# Patient Record
Sex: Female | Born: 1935 | Race: White | Hispanic: No | Marital: Single | Smoking: Former smoker
Health system: Southern US, Community
[De-identification: ages and names within clinical notes are randomized; demographics above are authoritative.]

## PROBLEM LIST (undated history)

## (undated) DIAGNOSIS — I480 Paroxysmal atrial fibrillation: Secondary | ICD-10-CM

## (undated) DIAGNOSIS — I739 Peripheral vascular disease, unspecified: Secondary | ICD-10-CM

## (undated) DIAGNOSIS — C679 Malignant neoplasm of bladder, unspecified: Secondary | ICD-10-CM

## (undated) DIAGNOSIS — I219 Acute myocardial infarction, unspecified: Secondary | ICD-10-CM

## (undated) DIAGNOSIS — Z87891 Personal history of nicotine dependence: Secondary | ICD-10-CM

## (undated) DIAGNOSIS — Z794 Long term (current) use of insulin: Secondary | ICD-10-CM

## (undated) DIAGNOSIS — E119 Type 2 diabetes mellitus without complications: Secondary | ICD-10-CM

## (undated) DIAGNOSIS — IMO0001 Reserved for inherently not codable concepts without codable children: Secondary | ICD-10-CM

## (undated) DIAGNOSIS — I2510 Atherosclerotic heart disease of native coronary artery without angina pectoris: Secondary | ICD-10-CM

## (undated) DIAGNOSIS — Z950 Presence of cardiac pacemaker: Secondary | ICD-10-CM

## (undated) DIAGNOSIS — E785 Hyperlipidemia, unspecified: Secondary | ICD-10-CM

## (undated) DIAGNOSIS — H9191 Unspecified hearing loss, right ear: Secondary | ICD-10-CM

## (undated) DIAGNOSIS — H9190 Unspecified hearing loss, unspecified ear: Secondary | ICD-10-CM

## (undated) HISTORY — DX: Atherosclerotic heart disease of native coronary artery without angina pectoris: I25.10

## (undated) HISTORY — DX: Type 2 diabetes mellitus without complications: E11.9

## (undated) HISTORY — DX: Peripheral vascular disease, unspecified: I73.9

## (undated) HISTORY — DX: Malignant neoplasm of bladder, unspecified: C67.9

## (undated) HISTORY — DX: Paroxysmal atrial fibrillation: I48.0

## (undated) HISTORY — DX: Reserved for inherently not codable concepts without codable children: IMO0001

## (undated) HISTORY — PX: ABDOMINAL HYSTERECTOMY: SHX81

## (undated) HISTORY — DX: Personal history of nicotine dependence: Z87.891

## (undated) HISTORY — PX: CATARACT EXTRACTION: SUR2

## (undated) HISTORY — DX: Presence of cardiac pacemaker: Z95.0

## (undated) HISTORY — DX: Acute myocardial infarction, unspecified: I21.9

## (undated) HISTORY — DX: Hyperlipidemia, unspecified: E78.5

## (undated) HISTORY — DX: Long term (current) use of insulin: Z79.4

## (undated) HISTORY — PX: CARPAL TUNNEL RELEASE: SHX101

---

## 1988-07-28 HISTORY — PX: BLADDER REPAIR: SHX76

## 1991-07-29 DIAGNOSIS — I219 Acute myocardial infarction, unspecified: Secondary | ICD-10-CM

## 1991-07-29 HISTORY — DX: Acute myocardial infarction, unspecified: I21.9

## 1991-07-29 HISTORY — PX: CORONARY ARTERY BYPASS GRAFT: SHX141

## 2005-07-28 DIAGNOSIS — C679 Malignant neoplasm of bladder, unspecified: Secondary | ICD-10-CM

## 2005-07-28 HISTORY — DX: Malignant neoplasm of bladder, unspecified: C67.9

## 2005-07-28 HISTORY — PX: BLADDER SURGERY: SHX569

## 2006-04-03 HISTORY — PX: CARDIAC CATHETERIZATION: SHX172

## 2006-08-17 ENCOUNTER — Ambulatory Visit (HOSPITAL_COMMUNITY): Admission: RE | Admit: 2006-08-17 | Discharge: 2006-08-17 | Payer: Self-pay | Admitting: Urology

## 2007-01-07 ENCOUNTER — Ambulatory Visit (HOSPITAL_COMMUNITY): Admission: RE | Admit: 2007-01-07 | Discharge: 2007-01-07 | Payer: Self-pay | Admitting: *Deleted

## 2007-01-13 ENCOUNTER — Ambulatory Visit (HOSPITAL_COMMUNITY): Admission: RE | Admit: 2007-01-13 | Discharge: 2007-01-13 | Payer: Self-pay | Admitting: Cardiology

## 2007-07-27 ENCOUNTER — Inpatient Hospital Stay (HOSPITAL_COMMUNITY): Admission: EM | Admit: 2007-07-27 | Discharge: 2007-07-31 | Payer: Self-pay | Admitting: Emergency Medicine

## 2007-07-30 HISTORY — PX: PACEMAKER INSERTION: SHX728

## 2010-06-04 ENCOUNTER — Ambulatory Visit (HOSPITAL_COMMUNITY): Admission: RE | Admit: 2010-06-04 | Discharge: 2010-06-04 | Payer: Self-pay | Admitting: Ophthalmology

## 2010-06-11 ENCOUNTER — Emergency Department (HOSPITAL_COMMUNITY): Admission: EM | Admit: 2010-06-11 | Discharge: 2010-06-11 | Payer: Self-pay | Admitting: Emergency Medicine

## 2010-06-14 ENCOUNTER — Ambulatory Visit (HOSPITAL_COMMUNITY)
Admission: RE | Admit: 2010-06-14 | Discharge: 2010-06-14 | Payer: Self-pay | Source: Home / Self Care | Admitting: Internal Medicine

## 2010-06-18 ENCOUNTER — Ambulatory Visit (HOSPITAL_COMMUNITY): Admission: RE | Admit: 2010-06-18 | Discharge: 2010-06-18 | Payer: Self-pay | Admitting: Ophthalmology

## 2010-06-19 ENCOUNTER — Ambulatory Visit (HOSPITAL_COMMUNITY): Admission: RE | Admit: 2010-06-19 | Discharge: 2010-06-19 | Payer: Self-pay | Admitting: Orthopedic Surgery

## 2010-08-17 ENCOUNTER — Encounter: Payer: Self-pay | Admitting: Internal Medicine

## 2010-10-08 LAB — DIFFERENTIAL
Basophils Absolute: 0.1 10*3/uL (ref 0.0–0.1)
Eosinophils Absolute: 0.6 10*3/uL (ref 0.0–0.7)
Eosinophils Relative: 4 % (ref 0–5)
Lymphs Abs: 2.1 10*3/uL (ref 0.7–4.0)
Monocytes Absolute: 1.3 10*3/uL — ABNORMAL HIGH (ref 0.1–1.0)

## 2010-10-08 LAB — CBC
HCT: 40.9 % (ref 36.0–46.0)
MCH: 28 pg (ref 26.0–34.0)
MCV: 85.4 fL (ref 78.0–100.0)
Platelets: 330 10*3/uL (ref 150–400)
RDW: 13.7 % (ref 11.5–15.5)

## 2010-10-08 LAB — GLUCOSE, CAPILLARY
Glucose-Capillary: 83 mg/dL (ref 70–99)
Glucose-Capillary: 88 mg/dL (ref 70–99)

## 2010-10-08 LAB — BASIC METABOLIC PANEL
BUN: 23 mg/dL (ref 6–23)
CO2: 27 mEq/L (ref 19–32)
Chloride: 105 mEq/L (ref 96–112)
Creatinine, Ser: 1.03 mg/dL (ref 0.4–1.2)
Potassium: 4.3 mEq/L (ref 3.5–5.1)

## 2010-10-08 LAB — ANAEROBIC CULTURE

## 2010-10-08 LAB — PROTIME-INR: Prothrombin Time: 21.1 seconds — ABNORMAL HIGH (ref 11.6–15.2)

## 2010-10-08 LAB — POCT I-STAT 4, (NA,K, GLUC, HGB,HCT)
Glucose, Bld: 101 mg/dL — ABNORMAL HIGH (ref 70–99)
HCT: 41 % (ref 36.0–46.0)
Hemoglobin: 13.9 g/dL (ref 12.0–15.0)
Potassium: 4.8 mEq/L (ref 3.5–5.1)
Sodium: 141 mEq/L (ref 135–145)

## 2010-10-08 LAB — WOUND CULTURE

## 2010-10-08 LAB — CREATININE, SERUM: GFR calc non Af Amer: 44 mL/min — ABNORMAL LOW (ref >60)

## 2010-11-29 ENCOUNTER — Encounter (HOSPITAL_BASED_OUTPATIENT_CLINIC_OR_DEPARTMENT_OTHER)
Admission: RE | Admit: 2010-11-29 | Discharge: 2010-11-29 | Disposition: A | Discharge status: Home / Self Care | Payer: Medicare Other | Source: Ambulatory Visit | Attending: Orthopedic Surgery | Admitting: Orthopedic Surgery

## 2010-11-29 LAB — BASIC METABOLIC PANEL
BUN: 27 mg/dL — ABNORMAL HIGH (ref 6–23)
CO2: 30 mEq/L (ref 19–32)
Chloride: 103 mEq/L (ref 96–112)
Glucose, Bld: 121 mg/dL — ABNORMAL HIGH (ref 70–99)
Potassium: 4.9 mEq/L (ref 3.5–5.1)
Sodium: 138 mEq/L (ref 135–145)

## 2010-12-02 ENCOUNTER — Ambulatory Visit (HOSPITAL_BASED_OUTPATIENT_CLINIC_OR_DEPARTMENT_OTHER)
Admission: RE | Admit: 2010-12-02 | Discharge: 2010-12-02 | Disposition: A | Discharge status: Home / Self Care | Payer: Medicare Other | Source: Ambulatory Visit | Attending: Orthopedic Surgery | Admitting: Orthopedic Surgery

## 2010-12-02 ENCOUNTER — Other Ambulatory Visit: Payer: Self-pay | Admitting: Orthopedic Surgery

## 2010-12-02 DIAGNOSIS — I252 Old myocardial infarction: Secondary | ICD-10-CM | POA: Insufficient documentation

## 2010-12-02 DIAGNOSIS — Z01812 Encounter for preprocedural laboratory examination: Secondary | ICD-10-CM | POA: Insufficient documentation

## 2010-12-02 DIAGNOSIS — G5600 Carpal tunnel syndrome, unspecified upper limb: Secondary | ICD-10-CM | POA: Insufficient documentation

## 2010-12-02 DIAGNOSIS — Z79899 Other long term (current) drug therapy: Secondary | ICD-10-CM | POA: Insufficient documentation

## 2010-12-02 DIAGNOSIS — E119 Type 2 diabetes mellitus without complications: Secondary | ICD-10-CM | POA: Insufficient documentation

## 2010-12-02 DIAGNOSIS — M799 Soft tissue disorder, unspecified: Secondary | ICD-10-CM | POA: Insufficient documentation

## 2010-12-02 DIAGNOSIS — Z950 Presence of cardiac pacemaker: Secondary | ICD-10-CM | POA: Insufficient documentation

## 2010-12-02 DIAGNOSIS — G4733 Obstructive sleep apnea (adult) (pediatric): Secondary | ICD-10-CM | POA: Insufficient documentation

## 2010-12-02 DIAGNOSIS — Z951 Presence of aortocoronary bypass graft: Secondary | ICD-10-CM | POA: Insufficient documentation

## 2010-12-02 LAB — GLUCOSE, CAPILLARY: Glucose-Capillary: 164 mg/dL — ABNORMAL HIGH (ref 70–99)

## 2010-12-03 NOTE — Op Note (Signed)
Megan Haley NO.:  [id number]  MEDICAL RECORD NO.:  [id number]           PATIENT TYPE:  LOCATION:                                 FACILITY:  PHYSICIAN:  Madelynn Done, MD  DATE OF BIRTH:  02-19-1932  DATE OF PROCEDURE:  12/02/2010 DATE OF DISCHARGE:                              OPERATIVE REPORT   PREOPERATIVE DIAGNOSES: 1. Left hand, carpal tunnel. 2. Left thumb subfascial mass less than 1.5 cm.  POSTOPERATIVE DIAGNOSIS: 1. Left hand, carpal tunnel. 2. Left thumb subfascial mass less than 1.5 cm.  ATTENDING PHYSICIAN:  Madelynn Done, MD who scrubbed and present for the entire procedure.  ASSISTANT SURGEON:  None.  SURGICAL PROCEDURES: 1. Left hand carpal tunnel release. 2. Left thumb subfascial mass removal less than 1.5 cm.  ANESTHESIA:  1% Xylocaine, 0.25% Marcaine, local block with IV sedation.  SURGICAL INDICATIONS:  Megan Haley is a 77 year old female with signs and symptoms consistent with left hand carpal tunnel.  This has been refractory to conservative treatment.  The patient also had symptomatic mass over the thumb web space.  The patient elected to undergo the above procedure.  Risks, benefits, and alternatives were discussed in detail with the patient and signed informed consent was obtained.  Risks include but not limited to bleeding, infection, damage to nearby nerves, arteries or tendons, loss of motion of the wrist and digits, persistent numbness, and need for further surgical intervention.  DESCRIPTION OF PROCEDURE:  The patient was properly identified in the preop holding area and a mark with permanent marker was made on left hand to indicate correct operative site.  The patient was then brought back to the operating room and placed supine on the anesthesia room table and IV sedation was administered.  The patient received preoperative antibiotics.  Left upper extremity was prepped and draped in normal sterile  fashion.  Time out had been called and correct side was identified and the procedure was then begun.  Attention was then turned to the left hand where several centimeter incision was then made directly in the mid palm.  The limb had been elevated and tourniquet insufflated.  Dissection was then carried down through the skin and subcutaneous tissue.  Palmar fascia was then identified and incised longitudinally.  Under direct visualization, the distal one-half of the transverse carpal ligament was released with a #15 blade.  Further exposure was then carried out proximally where the carpal tunnel release plate was placed on the transverse carpal ligament and then slid proximally.  Contact of the transverse carpal ligament was maintained throughout passing.  Following this after appropriate alignment was maintained, the carpal tunnel release plate was engaged into the guiding remaining portion of the transverse carpal ligament released in its entirety.  The wound was then thoroughly irrigated.  The contents of carpal canal were then inspected.  No other abnormalities were noted. Copious wound irrigation was done throughout.  Following this the turn the wound was then thoroughly irrigated and the skin was then closed using horizontal mattress, 4-0 Prolene sutures.  Attention was then turned to the left thumb where  a longitudinal incision was made directly over the thumb, ulnar border in the web space extending across the MP crease.  Deep dissection was then carried out through the subfascial layers.  The mass was then encountered.  Careful protection of the neurovascular bundle was then done on the ulnar aspect.  Circumferential dissection of the subfascial mass was then carried out and the mass was then excised.  After mass excision, the wound was then irrigated.  The tourniquet was deflated.  There was good perfusion of the thumb following this.  The skin was then closed using simple Prolene  sutures. Adaptic dressings were then applied.  Sterile compressive bandage was then applied.  The patient tolerated the procedure well and returned to recovery room in good condition.  POSTOPERATIVE PLAN:  The patient will be discharged to home, seen back in the office in approximately 2 weeks, wound check, suture removal, and go over the pathology.  INTRAOPERATIVE SPECIMENS:  Left thumb mass to pathology.     Madelynn Done, MD     FWO/MEDQ  D:  12/02/2010  T:  12/02/2010  Job:  [id number]  Electronically Signed by Bradly Bienenstock IV MD on 12/03/2010 09:04:08 AM

## 2010-12-10 NOTE — Op Note (Signed)
Megan Haley, Megan Haley NO.:  [id number]   MEDICAL RECORD NO.:  [id number]          PATIENT TYPE:  INP   LOCATION:  3713                         FACILITY:  [facility]   PHYSICIAN:  Darlin Priestly, MD  DATE OF BIRTH:  09/03/1931   DATE OF PROCEDURE:  07/30/2007  DATE OF DISCHARGE:                               OPERATIVE REPORT   PROCEDURE:  Insertion of a Medtronic Adapta ADDR01 generator, serial  [id number] with active atrial and ventricular lead.   ATTENDING:  Darlin Priestly, M.D.  Ritta Slot, M.D.   COMPLICATIONS:  None.   INDICATIONS:  Megan Haley is a 77 year old female patient of Dr. Dani Gobble, with a history of known CAD status post MI in 1993 with  subsequent bypass surgery.  She has undergone cardiac catheterization  with known occluded grafts.  She does have a history of paroxysmal  atrial fib/flutter.  She was seen on July 27, 2007, by Dr. Daphene Jaeger  with significant bradycardia with intermittent complete heart block.  She was subsequently admitted to the hospital.  She has had her negative  chronotropic agents held, but she has continued to be bradycardic with  need for treatment of her CAD.  She is now referred for dual chamber  pacer implant to allow treatment of her bradycardia, PAF and CAD.   DESCRIPTION OF PROCEDURE:  After obtaining informed written consent, the  patient was brought to the cardiac cath lab where her left chest was  shaved, prepped and draped in a sterile fashion.  ECG monitoring was  established.  1% lidocaine was used to anesthetize the left mid  subclavicular area.  Next, an approximately 3 cm horizontal mid  infraclavicular incision was carried out and hemostasis obtained with  electrocautery.  Blunt dissection was used to carry this down to the  left pectoral fascia and an approximately 3 by 4 cm pocket was then  created in the left pectoral fascia.  The left subclavian vein was then  easily entered x2 with  two retained guidewires.  A 4-0 silk suture was  then placed at the base of the retained guidewires.  Over the first  retained guidewire, a 7 French dilator and sheath were then easily  inserted into the left subclavian vein and the dilator and guidewire  were removed.  Through this, a 52 cm active Medtronic lead, model number  5076, serial number [id number], was then easily passed into the right  atrium and the peel away sheath was removed.  Over the second retained  guidewire, a second 7 [place] dilator and sheath was then easily passed  into the left subclavian vein and the dilator and guidewire were  removed.  Through this, a 45 cm active Medtronic lead, model number  5076, serial number [id number], was then passed into the right atrium  and the peel away sheath was removed.  A J curve was then placed on the  ventricular lead stylet and the ventricular lead allowed to prolapse in  the tricuspid valve and positioned in the RV apex without difficulty.  R-  waves measured approximately 15 mV.  The capture was 2 volts, the screws  were extended, and thresholds determined.  R-waves measured 18.6 mV,  impedance 889 ohms, the threshold in the ventricle was 0.4 volts at 0.5  milliseconds.  Current was 0.5 milliamps.  10 volts were negative for  diaphragmatic stimulation.  There was an excellent entry current noted.  The preformed atrial J lead was then placed in atrial stylet, the atrial  lead was then positioned in the area of the right atrial appendage.  We  were able to sense adequate P-waves at 2 mV and capture at 2 volts.  The  screws were then extended.  Thresholds were then determined.  P-waves  measured 2.4 mV, impedance 737 ohms, threshold 8.6 volts a 0.5  milliseconds.  Currents 1 mA.  10 volts again negative for diaphragmatic  stimulation.  The leads were sutured in place with 2-0 silk sutures per  lead and anchored to the subpectoral fascia.  1% kanamycin solution was  used to  irrigate the pocket and, again, hemostasis was confirmed.  The  leads were connected in a serial fashion to a Medtronic Adapta ADDR01  generator, serial number [id number], head screws were tightened and  pacing was confirmed.  A single silk suture was placed in the superior  aspect of the pocket.  The generator leads were then delivered in the  pocket and the header secured to the silk suture.  The subcutaneous  layer was then closed using 2-0 Vicryl.  The skin was then closed using  4-0 Vicryl.  Steri-Strips were applied.  The patient was transferred to  the recovery room in stable condition.   CONCLUSIONS:  Successful implant of a Medtronic Adapta [id number] generator,  serial number [id number], with active atrial and ventricular leads.      Darlin Priestly, MD  Electronically Signed     RHM/MEDQ  D:  07/30/2007  T:  07/30/2007  Job:  [id number]   cc:   Dani Gobble, MD

## 2010-12-10 NOTE — Cardiovascular Report (Signed)
NAME:  Megan Haley, Megan Haley             ACCOUNT NO.:  [id number]   MEDICAL RECORD NO.:  [id number]          PATIENT TYPE:  AMB   LOCATION:  ENDO                         FACILITY:  [facility]   PHYSICIAN:  Vonna Kotyk R. Jacinto Halim, MD       DATE OF BIRTH:  Apr 22, 1932   DATE OF PROCEDURE:  01/13/2007  DATE OF DISCHARGE:                            CARDIAC CATHETERIZATION   PROCEDURE PERFORMED:  TEE-guided electrical cardioversion.   HISTORY:  The patient is a 77 year old female with new onset of atrial  flutter, which is rate controlled.  She was found to have a mild  decrease in LV systolic function.  She has been put on Coumadin and an  attempt is being now made for restoring her back into sinus rhythm.   TEE has confirmed near normal LV systolic function, normal left atrial  size.  The right atrium was moderate to markedly enlarged.  The right  ventricle was also moderately dilated and LV systolic function was  reduced.  There was moderate to moderately severe tricuspid  regurgitation and moderate to moderately severe pulmonary hypertension.  There was no evidence of left atrial appendage clot, hence, we decided  to proceed with cardioversion.   TECHNIQUE OF CARDIOVERSION:  Using 75 mg of Pentothal for deep sedation,  a synchronize direct current cardioversion was performed using 50 joules  of electricity was delivered using a biphasic defibrillator.  The  patient converted from atrial flutter to junctional escape rhythm at  rate of 40 beats per minute.  EKG has been ordered.  We will continue to  monitor her closely.  If she maintains sinus rhythm, she will be  discharged home, however, if she remains in junctional rhythm, she  probably will need to be observed overnight.  Otherwise, no other  complications are noted.  She remained hemodynamically stable during the  entire procedure.      Cristy Hilts. Jacinto Halim, MD  Electronically Signed     JRG/MEDQ  D:  01/13/2007  T:  01/13/2007  Job:  [id number]   cc:    Dani Gobble, MD

## 2010-12-10 NOTE — H&P (Signed)
Megan Haley, TOM NO.:  [id number]   MEDICAL RECORD NO.:  [id number]          PATIENT TYPE:  INP   LOCATION:  3713                         FACILITY:  [facility]   PHYSICIAN:  Nicki Guadalajara, M.D.     DATE OF BIRTH:  Nov 28, 1931   DATE OF ADMISSION:  07/27/2007  DATE OF DISCHARGE:                              HISTORY & PHYSICAL   CHIEF COMPLAINT:  Dyspnea on exertion.   HISTORY OF PRESENT ILLNESS:  [name] is a pleasant 77 year old  female from [place] who has a history of coronary disease.  She had an  MI in 1993 and had bypass surgery x3.  She had a cath up there about a  year ago prior to some bladder surgery which reportedly was okay.  She  says one of her grafts was occluded but otherwise she was cleared for  bladder surgery.  Dr. Domingo Sep has seen her for atrial flutter and  atrial fib and she has had a cardioversion.  Please see Dr. Roque Lias  office notes for complete details.  She has done well since then but  developed increasing dyspnea on exertion about 2 weeks ago.  She was  seen in the office today in [place] and found to be bradycardic.  Dr.  Tresa Endo thought she was in sinus bradycardia but also felt there were runs  of complete heart block with rates in the 30s.  She is on beta blocker.  She is admitted now for further evaluation.  Currently she is symptom-  free with a heart rate of 45 and sinus bradycardia.   PAST MEDICAL HISTORY:  Remarkable for type 2 diabetes.  She had bladder  cancer treated with surgery about a year ago in [place].  She has had a  history of sleep apnea and is on CPAP.   CURRENT MEDICATIONS:  1. Cartia XT 240 daily.  2. Lipitor 10 mg h.s.  3. Zoloft 5 mg h.s.  4. Januvia 50 mg a day.  5. Lantus insulin 38 units h.s.  6. Coumadin 2.5 mg a day.  7. Metoprolol 25 mg b.i.d.  8. Diovan 80 mg 1/2 tablet a day.  9. CPAP.   ALLERGIES:  She has a reported intolerance to the lisinopril which gave  her cough.  She has no  other drug allergies.   SOCIAL HISTORY:  She is separated.  She quit smoking in 1993.  She has  three children, seven grandchildren, two great-grandchildren.  She is  retired from school system.   FAMILY HISTORY:  Father died at 37 of a MI.  Mother died in her 24s of  pneumonia.   REVIEW OF SYSTEMS:  Essentially unremarkable except for noted above, she  denies any GI bleeding or melena.  She has not had thyroid problems.  She denies any kidney stones or kidney problems.  She has not had  syncope.   PHYSICAL EXAMINATION:  VITAL SIGNS:  Blood pressure 110/54, pulse 45,  respirations 12.  GENERAL:  She is well-developed, well-nourished female in no acute  distress.  HEENT: Normocephalic, atraumatic.  Extraocular movements are intact.  Sclerae  anicteric.  She has poor dentition.  NECK:  Without JVD or bruit.  CHEST:  Reveals diminished breath sounds but no wheezing or rhonchi.  CARDIAC:  Reveals bradycardia with a 1/6 systolic murmur.  ABDOMEN:  Nontender.  Bowel sounds are present.  EXTREMITIES:  Without edema.  Distal pulses are intact.  NEUROLOGIC:  Grossly intact.  She is awake, alert and oriented,  cooperative.  Moves all extremities without obvious deficit.  SKIN:  Warm and dry.   IMPRESSION:  1. Dyspnea on exertion.  2. Bradycardia with transient complete heart block.  3. Coronary disease, coronary artery bypass grafting in 1993 with      catheterization approximately a year ago showing favorable anatomy,      apparently she had one graft occluded and she was treated      medically.  4. Good LV function by echocardiogram in .  5. Type 2 diabetes.  6. History of bladder cancer.  7. Treated dyslipidemia.  8. History of atrial fibrillation, atrial flutter, status post      cardioversion this summer.  9. Sleep apnea on CPAP.   PLAN:  The patient was seen by Dr. Tresa Endo in [place].  She will be  admitted to telemetry.  Will hold her Cartia and metoprolol.   Hopefully  she will not require a pacemaker.  Will continue her Coumadin.      Abelino Derrick, P.A.    ______________________________  Nicki Guadalajara, M.D.    Lenard Lance  D:  07/27/2007  T:  07/28/2007  Job:  [id number]

## 2010-12-13 NOTE — Discharge Summary (Signed)
Megan Haley, Megan Haley NO.:  [id number]   MEDICAL RECORD NO.:  [id number]          PATIENT TYPE:  INP   LOCATION:  3713                         FACILITY:  [facility]   PHYSICIAN:  Nicki Guadalajara, M.D.     DATE OF BIRTH:  15-Jun-1932   DATE OF ADMISSION:  07/27/2007  DATE OF DISCHARGE:  07/31/2007                               DISCHARGE SUMMARY   DISCHARGE DIAGNOSES:  1. Sick sinus syndrome with bradycardia and paroxysmal atrial      fibrillation with rapid response.  Placement of a DDDIR Medtronic      Adapta permanent pacemaker by Dr. Jenne Campus.  2. Anticoagulation secondary to paroxysmal atrial fibrillation.  3. Type 2 diabetes mellitus.  4. History of bladder cancer treated with surgery.  5. History of sleep apnea, uses CPAP.   ALLERGIES:  INTOLERANCE TO LISINOPRIL WHICH CAUSES COUGH.   DISCHARGE CONDITION:  Improved.   PROCEDURE:  On July 30, 2007, dual-chamber pacemaker, Adapta, serial  number [id number] placed by Dr. Lenise Herald and Dr. Lynnea Ferrier.   DISCHARGE MEDICATIONS:  1. Cardizem CD 180 mg daily which is a new dose down from 240.  2. Januvia 50 mg daily.  3. Metoprolol 25 mg twice a day.  4. Diovan 20 mg daily.  5. Coumadin will begin again on Monday after discharge at 2.5 mg daily      except 5 mg on Tuesday.  6. Zoloft 50 mg in the evening.  7. Lipitor 10 mg daily in the evening.  8. Lantus 38 units in the evening.  9. Continue CPAP at home.   DISCHARGE INSTRUCTIONS:  1. Low-sodium, heart healthy diet.  2. Pacemaker sheet instructions for discharge with pacemaker.  3. Follow up with Dr. Lynnea Ferrier in one week. The office will call with      date and time.   WOUND CARE:  See pacemaker sheet. The pacemaker was placed in the left  subclavian area.   HISTORY OF PRESENT ILLNESS:  A 77 year old female from [place] who has  a history of coronary disease with a MI in 1993, bypass grafting x2. A  year prior to this admission, she had cardiac  catheterization in [place] prior to bladder surgery which she reports was fine. One of her  grafts was occluded, but she was cleared for bladder surgery. In the  past, she has been seen by Dr. Domingo Sep for atrial flutter and atrial  fibrillation and has had a cardioversion. She has done quite well for  about two weeks. She has had increasing dyspnea on exertion. She was  bradycardia in the office on July 27, 2007, and there were also runs  of complete heart block with rates in the 30s. She was on beta blockers.  She was admitted to [facility] for further evaluation. She was  in sinus brady on arrival. Other history includes diabetes, bladder  cancer, and sleep apnea on CPAP. Also diabetes as stated.   FAMILY HISTORY SOCIAL HISTORY REVIEW OF SYSTEMS:  See HPI.   PHYSICAL EXAMINATION:  At discharge, blood pressure 108/64 - had been as  high as  163/63, pulse 68, respiratory rate 20, temperature 98.3, oxygen  saturation 2 liters, 93%.  HEART:  Regular rate and rhythm.  LUNGS:  Were clear.  Pacer site without hematoma.   LABORATORY DATA:  Hemoglobin 12.1, hematocrit 36, platelets 203, WBC 10.  These remained stable. Stool for occult blood was negative. INR on  admission was 2.4 with a pro time of 27. Coumadin was held for pacer  placement and was restarted after discharge.   Chemistry:  Sodium 138, potassium 4.7, chloride 107, CO2 27, glucose  220, BUN 23, creatinine 0.85. GFR was greater than 60. Total protein  7.6, albumin 3.3, AST 18, ALT 71, alkaline phosphatase 96, total  bilirubin 0.9. CKs 72 and 49 with MB of 2.6 and 2.1, troponin I 0.03.  TSH 4.617.   Radiology:  Chest x-ray July 31, 2007  - pacer wires in good position  without complicating features, mild cardiac enlargement but acute  pulmonary findings. EKG:  Sinus rhythm on discharge, and on admission  she was in sinus brady rate of 45.   HOSPITAL COURSE:  The patient was admitted in bradycardia with  some  complete heart block. Coumadin was held, and she was set up for  pacemaker once her INR was stable and not elevated. She underwent  permanent pacemaker July 30, 2007, tolerated the procedure well.  Within the next day, she was ambulating without problems. Chest x-ray:  No pneumothorax was noted, and she was ready for discharge home. She  will follow up as an outpatient as stated.      Darcella Gasman. Annie Paras, N.P.    ______________________________  Nicki Guadalajara, M.D.    LRI/MEDQ  D:  09/01/2007  T:  09/02/2007  Job:  [id number]   cc:   Nicki Guadalajara, M.D.  Dani Gobble, MD  Catalina Pizza, M.D.

## 2011-04-17 LAB — BASIC METABOLIC PANEL
BUN: 26 — ABNORMAL HIGH
CO2: 25
CO2: 28
Chloride: 109
GFR calc Af Amer: >60
GFR calc Af Amer: >60
GFR calc non Af Amer: >60
Glucose, Bld: 101 — ABNORMAL HIGH
Potassium: 4
Potassium: 4.6
Sodium: 141

## 2011-04-17 LAB — PROTIME-INR: INR: 1.7 — ABNORMAL HIGH

## 2011-04-17 LAB — CBC
HCT: 35.4 — ABNORMAL LOW
HCT: 35.9 — ABNORMAL LOW
Hemoglobin: 12.4
MCHC: 34.5
MCHC: 34.6
MCV: 81.4
RBC: 4.34
RBC: 4.45
RDW: 15.8 — ABNORMAL HIGH
WBC: 9.3

## 2011-05-02 LAB — COMPREHENSIVE METABOLIC PANEL
AST: 18
BUN: 23
CO2: 27
Calcium: 8.7
Chloride: 101
Creatinine, Ser: 0.85
GFR calc non Af Amer: >60
Glucose, Bld: 220 — ABNORMAL HIGH
Total Bilirubin: 0.9

## 2011-05-02 LAB — PROTIME-INR: INR: 2.3 — ABNORMAL HIGH

## 2011-05-02 LAB — POCT CARDIAC MARKERS
CKMB, poc: 2.8
Myoglobin, poc: 94.5
Operator id: 198171
Troponin i, poc: <0.05

## 2011-05-02 LAB — I-STAT 8, (EC8 V) (CONVERTED LAB)
BUN: 31 — ABNORMAL HIGH
Bicarbonate: 24.6 — ABNORMAL HIGH
Chloride: 107
Glucose, Bld: 147 — ABNORMAL HIGH
Hemoglobin: 13.6
Operator id: 198171
Sodium: 138

## 2011-05-02 LAB — CBC
HCT: 36
Hemoglobin: 12.1
MCV: 82.9
Platelets: 203
WBC: 10.3

## 2011-05-02 LAB — CARDIAC PANEL(CRET KIN+CKTOT+MB+TROPI)
CK, MB: 2.1
Relative Index: INVALID
Total CK: 49
Total CK: 72

## 2011-05-02 LAB — TSH: TSH: 4.617

## 2011-08-12 DIAGNOSIS — I4891 Unspecified atrial fibrillation: Secondary | ICD-10-CM | POA: Diagnosis not present

## 2011-08-14 DIAGNOSIS — C679 Malignant neoplasm of bladder, unspecified: Secondary | ICD-10-CM | POA: Diagnosis not present

## 2011-08-19 DIAGNOSIS — C679 Malignant neoplasm of bladder, unspecified: Secondary | ICD-10-CM | POA: Diagnosis not present

## 2011-09-09 DIAGNOSIS — I4891 Unspecified atrial fibrillation: Secondary | ICD-10-CM | POA: Diagnosis not present

## 2011-09-17 DIAGNOSIS — E119 Type 2 diabetes mellitus without complications: Secondary | ICD-10-CM | POA: Diagnosis not present

## 2011-09-17 DIAGNOSIS — E785 Hyperlipidemia, unspecified: Secondary | ICD-10-CM | POA: Diagnosis not present

## 2011-09-17 DIAGNOSIS — I10 Essential (primary) hypertension: Secondary | ICD-10-CM | POA: Diagnosis not present

## 2011-10-07 DIAGNOSIS — I4891 Unspecified atrial fibrillation: Secondary | ICD-10-CM | POA: Diagnosis not present

## 2011-11-04 DIAGNOSIS — I4891 Unspecified atrial fibrillation: Secondary | ICD-10-CM | POA: Diagnosis not present

## 2011-12-10 DIAGNOSIS — E785 Hyperlipidemia, unspecified: Secondary | ICD-10-CM | POA: Diagnosis not present

## 2011-12-10 DIAGNOSIS — I259 Chronic ischemic heart disease, unspecified: Secondary | ICD-10-CM | POA: Diagnosis not present

## 2011-12-10 DIAGNOSIS — I4891 Unspecified atrial fibrillation: Secondary | ICD-10-CM | POA: Diagnosis not present

## 2011-12-12 DIAGNOSIS — I4891 Unspecified atrial fibrillation: Secondary | ICD-10-CM | POA: Diagnosis not present

## 2011-12-15 DIAGNOSIS — E785 Hyperlipidemia, unspecified: Secondary | ICD-10-CM | POA: Diagnosis not present

## 2011-12-15 DIAGNOSIS — Z45018 Encounter for adjustment and management of other part of cardiac pacemaker: Secondary | ICD-10-CM | POA: Diagnosis not present

## 2011-12-15 DIAGNOSIS — I739 Peripheral vascular disease, unspecified: Secondary | ICD-10-CM | POA: Diagnosis not present

## 2011-12-15 DIAGNOSIS — I2510 Atherosclerotic heart disease of native coronary artery without angina pectoris: Secondary | ICD-10-CM | POA: Diagnosis not present

## 2011-12-15 DIAGNOSIS — I447 Left bundle-branch block, unspecified: Secondary | ICD-10-CM | POA: Diagnosis not present

## 2011-12-15 DIAGNOSIS — I10 Essential (primary) hypertension: Secondary | ICD-10-CM | POA: Diagnosis not present

## 2012-02-06 DIAGNOSIS — I4891 Unspecified atrial fibrillation: Secondary | ICD-10-CM | POA: Diagnosis not present

## 2012-02-12 DIAGNOSIS — C679 Malignant neoplasm of bladder, unspecified: Secondary | ICD-10-CM | POA: Diagnosis not present

## 2012-02-16 DIAGNOSIS — D414 Neoplasm of uncertain behavior of bladder: Secondary | ICD-10-CM | POA: Diagnosis not present

## 2012-03-05 DIAGNOSIS — I4891 Unspecified atrial fibrillation: Secondary | ICD-10-CM | POA: Diagnosis not present

## 2012-03-26 DIAGNOSIS — Z7901 Long term (current) use of anticoagulants: Secondary | ICD-10-CM | POA: Diagnosis not present

## 2012-03-26 DIAGNOSIS — I4891 Unspecified atrial fibrillation: Secondary | ICD-10-CM | POA: Diagnosis not present

## 2012-04-12 DIAGNOSIS — R809 Proteinuria, unspecified: Secondary | ICD-10-CM | POA: Diagnosis not present

## 2012-04-12 DIAGNOSIS — E782 Mixed hyperlipidemia: Secondary | ICD-10-CM | POA: Diagnosis not present

## 2012-04-12 DIAGNOSIS — I10 Essential (primary) hypertension: Secondary | ICD-10-CM | POA: Diagnosis not present

## 2012-04-22 DIAGNOSIS — Z23 Encounter for immunization: Secondary | ICD-10-CM | POA: Diagnosis not present

## 2012-04-23 DIAGNOSIS — I4891 Unspecified atrial fibrillation: Secondary | ICD-10-CM | POA: Diagnosis not present

## 2012-05-14 DIAGNOSIS — I4891 Unspecified atrial fibrillation: Secondary | ICD-10-CM | POA: Diagnosis not present

## 2012-06-07 DIAGNOSIS — I4891 Unspecified atrial fibrillation: Secondary | ICD-10-CM | POA: Diagnosis not present

## 2012-06-07 DIAGNOSIS — Z45018 Encounter for adjustment and management of other part of cardiac pacemaker: Secondary | ICD-10-CM | POA: Diagnosis not present

## 2012-06-07 DIAGNOSIS — I447 Left bundle-branch block, unspecified: Secondary | ICD-10-CM | POA: Diagnosis not present

## 2012-06-07 DIAGNOSIS — I2510 Atherosclerotic heart disease of native coronary artery without angina pectoris: Secondary | ICD-10-CM | POA: Diagnosis not present

## 2012-06-11 DIAGNOSIS — Z79899 Other long term (current) drug therapy: Secondary | ICD-10-CM | POA: Diagnosis not present

## 2012-06-11 DIAGNOSIS — R5381 Other malaise: Secondary | ICD-10-CM | POA: Diagnosis not present

## 2012-06-11 DIAGNOSIS — E782 Mixed hyperlipidemia: Secondary | ICD-10-CM | POA: Diagnosis not present

## 2012-06-16 ENCOUNTER — Other Ambulatory Visit (HOSPITAL_COMMUNITY): Payer: Self-pay | Admitting: Cardiovascular Disease

## 2012-06-16 DIAGNOSIS — R0989 Other specified symptoms and signs involving the circulatory and respiratory systems: Secondary | ICD-10-CM

## 2012-06-16 DIAGNOSIS — I739 Peripheral vascular disease, unspecified: Secondary | ICD-10-CM

## 2012-07-02 ENCOUNTER — Encounter (HOSPITAL_COMMUNITY): Payer: Medicare Other

## 2012-07-05 ENCOUNTER — Encounter (HOSPITAL_COMMUNITY): Payer: Medicare Other

## 2012-07-19 ENCOUNTER — Encounter (HOSPITAL_COMMUNITY): Payer: Medicare Other

## 2012-07-28 HISTORY — PX: OTHER SURGICAL HISTORY: SHX169

## 2012-08-05 DIAGNOSIS — E669 Obesity, unspecified: Secondary | ICD-10-CM | POA: Diagnosis not present

## 2012-08-05 DIAGNOSIS — E119 Type 2 diabetes mellitus without complications: Secondary | ICD-10-CM | POA: Diagnosis not present

## 2012-08-05 DIAGNOSIS — I259 Chronic ischemic heart disease, unspecified: Secondary | ICD-10-CM | POA: Diagnosis not present

## 2012-08-05 DIAGNOSIS — I10 Essential (primary) hypertension: Secondary | ICD-10-CM | POA: Diagnosis not present

## 2012-08-05 DIAGNOSIS — I4891 Unspecified atrial fibrillation: Secondary | ICD-10-CM | POA: Diagnosis not present

## 2012-08-05 DIAGNOSIS — E785 Hyperlipidemia, unspecified: Secondary | ICD-10-CM | POA: Diagnosis not present

## 2012-08-11 ENCOUNTER — Ambulatory Visit (HOSPITAL_COMMUNITY)
Admission: RE | Admit: 2012-08-11 | Discharge: 2012-08-11 | Disposition: A | Discharge status: Home / Self Care | Payer: Medicare Other | Source: Ambulatory Visit | Attending: Cardiovascular Disease | Admitting: Cardiovascular Disease

## 2012-08-11 DIAGNOSIS — I739 Peripheral vascular disease, unspecified: Secondary | ICD-10-CM | POA: Insufficient documentation

## 2012-08-11 DIAGNOSIS — R0989 Other specified symptoms and signs involving the circulatory and respiratory systems: Secondary | ICD-10-CM | POA: Diagnosis not present

## 2012-08-11 NOTE — Progress Notes (Signed)
BLE arterial duplex completed. Megan Haley  

## 2012-08-11 NOTE — Progress Notes (Signed)
Carotid duplex completed. Fintan Grater D  

## 2012-08-13 DIAGNOSIS — E039 Hypothyroidism, unspecified: Secondary | ICD-10-CM | POA: Diagnosis not present

## 2012-08-13 DIAGNOSIS — R5381 Other malaise: Secondary | ICD-10-CM | POA: Diagnosis not present

## 2012-08-19 DIAGNOSIS — Z1211 Encounter for screening for malignant neoplasm of colon: Secondary | ICD-10-CM | POA: Diagnosis not present

## 2012-08-26 DIAGNOSIS — I4891 Unspecified atrial fibrillation: Secondary | ICD-10-CM | POA: Diagnosis not present

## 2012-09-20 DIAGNOSIS — I4891 Unspecified atrial fibrillation: Secondary | ICD-10-CM | POA: Diagnosis not present

## 2012-10-03 ENCOUNTER — Ambulatory Visit: Payer: Self-pay | Admitting: Cardiovascular Disease

## 2012-10-03 DIAGNOSIS — I4891 Unspecified atrial fibrillation: Secondary | ICD-10-CM | POA: Insufficient documentation

## 2012-10-03 DIAGNOSIS — Z7901 Long term (current) use of anticoagulants: Secondary | ICD-10-CM | POA: Insufficient documentation

## 2012-10-21 DIAGNOSIS — I4891 Unspecified atrial fibrillation: Secondary | ICD-10-CM | POA: Diagnosis not present

## 2012-10-25 DIAGNOSIS — E119 Type 2 diabetes mellitus without complications: Secondary | ICD-10-CM | POA: Diagnosis not present

## 2012-10-25 DIAGNOSIS — I495 Sick sinus syndrome: Secondary | ICD-10-CM | POA: Diagnosis not present

## 2012-10-25 DIAGNOSIS — I447 Left bundle-branch block, unspecified: Secondary | ICD-10-CM | POA: Diagnosis not present

## 2012-10-25 DIAGNOSIS — Z45018 Encounter for adjustment and management of other part of cardiac pacemaker: Secondary | ICD-10-CM | POA: Diagnosis not present

## 2012-11-26 DIAGNOSIS — D65 Disseminated intravascular coagulation [defibrination syndrome]: Secondary | ICD-10-CM | POA: Diagnosis not present

## 2012-12-10 ENCOUNTER — Ambulatory Visit: Payer: Medicare Other

## 2012-12-21 ENCOUNTER — Ambulatory Visit: Payer: Medicare Other

## 2012-12-26 HISTORY — PX: NM MYOCAR PERF WALL MOTION: HXRAD629

## 2013-01-04 ENCOUNTER — Other Ambulatory Visit (HOSPITAL_COMMUNITY): Payer: Self-pay | Admitting: Cardiovascular Disease

## 2013-01-04 DIAGNOSIS — R011 Cardiac murmur, unspecified: Secondary | ICD-10-CM

## 2013-01-04 DIAGNOSIS — I472 Ventricular tachycardia: Secondary | ICD-10-CM

## 2013-01-04 DIAGNOSIS — I25810 Atherosclerosis of coronary artery bypass graft(s) without angina pectoris: Secondary | ICD-10-CM

## 2013-01-04 DIAGNOSIS — I447 Left bundle-branch block, unspecified: Secondary | ICD-10-CM

## 2013-01-04 DIAGNOSIS — R0989 Other specified symptoms and signs involving the circulatory and respiratory systems: Secondary | ICD-10-CM

## 2013-01-06 DIAGNOSIS — I4891 Unspecified atrial fibrillation: Secondary | ICD-10-CM | POA: Diagnosis not present

## 2013-01-06 DIAGNOSIS — Z7901 Long term (current) use of anticoagulants: Secondary | ICD-10-CM | POA: Diagnosis not present

## 2013-01-07 ENCOUNTER — Encounter: Payer: Self-pay | Admitting: Cardiovascular Disease

## 2013-01-19 ENCOUNTER — Ambulatory Visit (HOSPITAL_COMMUNITY)
Admission: RE | Admit: 2013-01-19 | Discharge: 2013-01-19 | Disposition: A | Discharge status: Home / Self Care | Payer: Medicare Other | Source: Ambulatory Visit | Attending: Cardiovascular Disease | Admitting: Cardiovascular Disease

## 2013-01-19 DIAGNOSIS — I25810 Atherosclerosis of coronary artery bypass graft(s) without angina pectoris: Secondary | ICD-10-CM

## 2013-01-19 DIAGNOSIS — R42 Dizziness and giddiness: Secondary | ICD-10-CM | POA: Insufficient documentation

## 2013-01-19 DIAGNOSIS — I252 Old myocardial infarction: Secondary | ICD-10-CM | POA: Diagnosis not present

## 2013-01-19 DIAGNOSIS — Z951 Presence of aortocoronary bypass graft: Secondary | ICD-10-CM | POA: Diagnosis not present

## 2013-01-19 DIAGNOSIS — Z794 Long term (current) use of insulin: Secondary | ICD-10-CM | POA: Diagnosis not present

## 2013-01-19 DIAGNOSIS — I10 Essential (primary) hypertension: Secondary | ICD-10-CM | POA: Diagnosis not present

## 2013-01-19 DIAGNOSIS — I472 Ventricular tachycardia: Secondary | ICD-10-CM

## 2013-01-19 DIAGNOSIS — R0989 Other specified symptoms and signs involving the circulatory and respiratory systems: Secondary | ICD-10-CM | POA: Insufficient documentation

## 2013-01-19 DIAGNOSIS — E119 Type 2 diabetes mellitus without complications: Secondary | ICD-10-CM | POA: Diagnosis not present

## 2013-01-19 DIAGNOSIS — Z9861 Coronary angioplasty status: Secondary | ICD-10-CM | POA: Diagnosis not present

## 2013-01-19 DIAGNOSIS — R0609 Other forms of dyspnea: Secondary | ICD-10-CM | POA: Diagnosis not present

## 2013-01-19 DIAGNOSIS — I2510 Atherosclerotic heart disease of native coronary artery without angina pectoris: Secondary | ICD-10-CM | POA: Insufficient documentation

## 2013-01-19 MED ORDER — TECHNETIUM TC 99M SESTAMIBI GENERIC - CARDIOLITE
30.2000 | Freq: Once | INTRAVENOUS | Status: AC | PRN
  Administered 2013-01-19: 30.2 via INTRAVENOUS

## 2013-01-19 MED ORDER — AMINOPHYLLINE 25 MG/ML IV SOLN
75.0000 mg | Freq: Once | INTRAVENOUS | Status: AC
  Administered 2013-01-19: 75 mg via INTRAVENOUS

## 2013-01-19 MED ORDER — TECHNETIUM TC 99M SESTAMIBI GENERIC - CARDIOLITE
10.4000 | Freq: Once | INTRAVENOUS | Status: AC | PRN
  Administered 2013-01-19: 10 via INTRAVENOUS

## 2013-01-19 MED ORDER — REGADENOSON 0.4 MG/5ML IV SOLN
0.4000 mg | Freq: Once | INTRAVENOUS | Status: AC
  Administered 2013-01-19: 0.4 mg via INTRAVENOUS

## 2013-01-19 NOTE — Procedures (Addendum)
[facility] [address] [place] [address] [phone]  Cardiology Nuclear Med Study  Megan Haley is a 79 y.o. female     MRN : [id number]     DOB: 1931/09/22  Procedure Date: 01/19/2013  Nuclear Med Background Indication for Stress Test:  Evaluation for Ischemia, Graft Patency and A-FIB;NSVT History:  CAD;MI-1992;CABG X3--1992;PTCA--1992 AND 2004;PACER Cardiac Risk Factors: Family History - CAD, Hypertension, IDDM Type 2, Lipids and Overweight  Symptoms:  Dizziness and DOE   Nuclear Pre-Procedure Caffeine/Decaff Intake:  10:00pm NPO After: 8:00am   IV Site: R Forearm  IV 0.9% NS with Angio Cath:  22g  Chest Size (in):  N/A IV Started by: Emmit Pomfret, RN  Height: 5' 2.5" (1.588 m)  Cup Size: B  BMI:  Body mass index is 28.78 kg/(m^2). Weight:  160 lb (72.576 kg)   Tech Comments:  N/A    Nuclear Med Study 1 or 2 day study: 1 day  Stress Test Type:  Lexiscan  Order Authorizing Provider:  Susa Griffins, MD   Resting Radionuclide: Technetium 84m Sestamibi  Resting Radionuclide Dose: 10.4 mCi   Stress Radionuclide:  Technetium 20m Sestamibi  Stress Radionuclide Dose: 30.2 mCi           Stress Protocol Rest HR: 70 Stress HR: 70  Rest BP: 101/61 Stress BP: 98/77  Exercise Time (min): n/a METS: n/a   Predicted Max HR: 142 bpm % Max HR: 49.3 bpm Rate Pressure Product: 7070  Dose of Adenosine (mg):  n/a Dose of Lexiscan: 0.4 mg  Dose of Atropine (mg): n/a Dose of Dobutamine: n/a mcg/kg/min (at max HR)  Stress Test Technologist: Esperanza Sheets, CCT Nuclear Technologist: Gonzella Lex, CNMT   Rest Procedure:  Myocardial perfusion imaging was performed at rest 45 minutes following the intravenous administration of Technetium 42m Sestamibi. Stress Procedure:  The patient received IV Lexiscan 0.4 mg over 15-seconds.  Technetium 55m Sestamibi injected at 30-seconds. The patient experienced SOB and  hypotension; 75 mg of IV Aminophylline was administered with resolution. There were no significant changes with Lexiscan.  Quantitative spect images were obtained after a 45 minute delay.  Transient Ischemic Dilatation (Normal <1.22):  0.95 Lung/Heart Ratio (Normal <0.45):  0.29 QGS EDV:  78 ml QGS ESV:  38 ml LV Ejection Fraction: 51%  Signed by  Gonzella Lex, CNMT  PHYSICIAN INTERPRETATION  Rest ECG: NSR-incLBBB (IVCD)  Stress ECG: No significant ST segment change suggestive of ischemia.  QPS Raw Data Images:  There is no interference from nuclear activity from structures below the diaphragm.   Stress Images:  There is decreased uptake in the anterior wall.  There is decreased uptake in the inferior wall. Minimal reversibility is noted. Rest Images:  There is decreased uptake in the anterior wall.  There is decreased uptake in the inferior wall. Comparison with the stress images reveals no significant change. Subtraction (SDS):  There is a medium to large sized, severe intensity fixed defect in the distal anterior to anteroapical  that is most consistent with a previous infarction with minimal peri-infarct ischemia. There is a small sized, mild intensity fixed defect in the distal anterior to anteroapical  that is most consistent with a previous infarction.  There is minimal reversibility, suggesting minimal per-infarct ischemia.  Impression Exercise Capacity:  Lexiscan with no exercise. BP Response:  Hypotensive at baseline with normal BP response. Clinical Symptoms:  There is dyspnea. ECG Impression:  No significant ECG changes with  Lexiscan. LV Wall Motion:  Mild anteroapical hypokinesis; Moderate to severe Inferior hypokinesis with poor thickening.  Comparison with Prior Nuclear Study: No images to compare  Overall Impression:  Low risk stress nuclear study with 2 relatively fixed perfusion defects consistent with prior infarction.Marland Kitchen     Marykay Lex, MD  01/19/2013 6:59  PM

## 2013-01-20 ENCOUNTER — Encounter: Payer: Self-pay | Admitting: Cardiovascular Disease

## 2013-01-25 HISTORY — PX: OTHER SURGICAL HISTORY: SHX169

## 2013-01-25 HISTORY — PX: TRANSTHORACIC ECHOCARDIOGRAM: SHX275

## 2013-01-26 ENCOUNTER — Ambulatory Visit (HOSPITAL_COMMUNITY)
Admission: RE | Admit: 2013-01-26 | Discharge: 2013-01-26 | Disposition: A | Discharge status: Home / Self Care | Payer: Medicare Other | Source: Ambulatory Visit | Attending: Cardiovascular Disease | Admitting: Cardiovascular Disease

## 2013-01-26 DIAGNOSIS — R011 Cardiac murmur, unspecified: Secondary | ICD-10-CM | POA: Diagnosis not present

## 2013-01-26 DIAGNOSIS — I495 Sick sinus syndrome: Secondary | ICD-10-CM | POA: Diagnosis not present

## 2013-01-26 DIAGNOSIS — E119 Type 2 diabetes mellitus without complications: Secondary | ICD-10-CM | POA: Insufficient documentation

## 2013-01-26 DIAGNOSIS — I2510 Atherosclerotic heart disease of native coronary artery without angina pectoris: Secondary | ICD-10-CM | POA: Diagnosis not present

## 2013-01-26 DIAGNOSIS — Z951 Presence of aortocoronary bypass graft: Secondary | ICD-10-CM | POA: Insufficient documentation

## 2013-01-26 DIAGNOSIS — R0989 Other specified symptoms and signs involving the circulatory and respiratory systems: Secondary | ICD-10-CM | POA: Diagnosis not present

## 2013-01-26 DIAGNOSIS — I10 Essential (primary) hypertension: Secondary | ICD-10-CM | POA: Diagnosis not present

## 2013-01-26 DIAGNOSIS — I252 Old myocardial infarction: Secondary | ICD-10-CM | POA: Diagnosis not present

## 2013-01-26 DIAGNOSIS — I739 Peripheral vascular disease, unspecified: Secondary | ICD-10-CM | POA: Insufficient documentation

## 2013-01-26 DIAGNOSIS — I447 Left bundle-branch block, unspecified: Secondary | ICD-10-CM | POA: Insufficient documentation

## 2013-01-26 DIAGNOSIS — Z87891 Personal history of nicotine dependence: Secondary | ICD-10-CM | POA: Diagnosis not present

## 2013-01-26 DIAGNOSIS — G4733 Obstructive sleep apnea (adult) (pediatric): Secondary | ICD-10-CM | POA: Insufficient documentation

## 2013-01-26 NOTE — Progress Notes (Signed)
[facility]   2D echo completed 01/26/2013.   Cindy Asuna Peth, RDCS  

## 2013-01-26 NOTE — Progress Notes (Signed)
Upper Ext. Arterial Duplex Completed. Marilynne Halsted, RDMS, RVT

## 2013-02-02 DIAGNOSIS — E119 Type 2 diabetes mellitus without complications: Secondary | ICD-10-CM | POA: Diagnosis not present

## 2013-02-02 DIAGNOSIS — I259 Chronic ischemic heart disease, unspecified: Secondary | ICD-10-CM | POA: Diagnosis not present

## 2013-02-02 DIAGNOSIS — I4891 Unspecified atrial fibrillation: Secondary | ICD-10-CM | POA: Diagnosis not present

## 2013-02-02 DIAGNOSIS — Z7901 Long term (current) use of anticoagulants: Secondary | ICD-10-CM | POA: Diagnosis not present

## 2013-02-02 DIAGNOSIS — I10 Essential (primary) hypertension: Secondary | ICD-10-CM | POA: Diagnosis not present

## 2013-02-03 DIAGNOSIS — E782 Mixed hyperlipidemia: Secondary | ICD-10-CM | POA: Diagnosis not present

## 2013-02-03 DIAGNOSIS — J449 Chronic obstructive pulmonary disease, unspecified: Secondary | ICD-10-CM | POA: Diagnosis not present

## 2013-02-03 DIAGNOSIS — Z45018 Encounter for adjustment and management of other part of cardiac pacemaker: Secondary | ICD-10-CM | POA: Diagnosis not present

## 2013-02-03 DIAGNOSIS — I472 Ventricular tachycardia: Secondary | ICD-10-CM | POA: Diagnosis not present

## 2013-02-05 ENCOUNTER — Encounter: Payer: Self-pay | Admitting: Cardiovascular Disease

## 2013-02-07 DIAGNOSIS — C679 Malignant neoplasm of bladder, unspecified: Secondary | ICD-10-CM | POA: Diagnosis not present

## 2013-02-14 DIAGNOSIS — D494 Neoplasm of unspecified behavior of bladder: Secondary | ICD-10-CM | POA: Diagnosis not present

## 2013-03-02 DIAGNOSIS — E119 Type 2 diabetes mellitus without complications: Secondary | ICD-10-CM | POA: Diagnosis not present

## 2013-03-02 DIAGNOSIS — I4891 Unspecified atrial fibrillation: Secondary | ICD-10-CM | POA: Diagnosis not present

## 2013-03-02 DIAGNOSIS — Z7901 Long term (current) use of anticoagulants: Secondary | ICD-10-CM | POA: Diagnosis not present

## 2013-03-30 DIAGNOSIS — I4891 Unspecified atrial fibrillation: Secondary | ICD-10-CM | POA: Diagnosis not present

## 2013-03-30 DIAGNOSIS — E119 Type 2 diabetes mellitus without complications: Secondary | ICD-10-CM | POA: Diagnosis not present

## 2013-03-30 DIAGNOSIS — Z7901 Long term (current) use of anticoagulants: Secondary | ICD-10-CM | POA: Diagnosis not present

## 2013-05-13 DIAGNOSIS — I4891 Unspecified atrial fibrillation: Secondary | ICD-10-CM | POA: Diagnosis not present

## 2013-05-13 DIAGNOSIS — M25519 Pain in unspecified shoulder: Secondary | ICD-10-CM | POA: Diagnosis not present

## 2013-05-13 DIAGNOSIS — Z7901 Long term (current) use of anticoagulants: Secondary | ICD-10-CM | POA: Diagnosis not present

## 2013-05-13 DIAGNOSIS — E119 Type 2 diabetes mellitus without complications: Secondary | ICD-10-CM | POA: Diagnosis not present

## 2013-05-13 DIAGNOSIS — Z23 Encounter for immunization: Secondary | ICD-10-CM | POA: Diagnosis not present

## 2013-05-13 DIAGNOSIS — M79609 Pain in unspecified limb: Secondary | ICD-10-CM | POA: Diagnosis not present

## 2013-05-17 DIAGNOSIS — M25519 Pain in unspecified shoulder: Secondary | ICD-10-CM | POA: Diagnosis not present

## 2013-05-18 DIAGNOSIS — M25519 Pain in unspecified shoulder: Secondary | ICD-10-CM | POA: Diagnosis not present

## 2013-05-20 DIAGNOSIS — M25519 Pain in unspecified shoulder: Secondary | ICD-10-CM | POA: Diagnosis not present

## 2013-05-23 DIAGNOSIS — M25519 Pain in unspecified shoulder: Secondary | ICD-10-CM | POA: Diagnosis not present

## 2013-05-25 DIAGNOSIS — M25519 Pain in unspecified shoulder: Secondary | ICD-10-CM | POA: Diagnosis not present

## 2013-05-26 DIAGNOSIS — M25519 Pain in unspecified shoulder: Secondary | ICD-10-CM | POA: Diagnosis not present

## 2013-05-30 DIAGNOSIS — M25519 Pain in unspecified shoulder: Secondary | ICD-10-CM | POA: Diagnosis not present

## 2013-05-31 DIAGNOSIS — M25519 Pain in unspecified shoulder: Secondary | ICD-10-CM | POA: Diagnosis not present

## 2013-06-02 DIAGNOSIS — M25519 Pain in unspecified shoulder: Secondary | ICD-10-CM | POA: Diagnosis not present

## 2013-06-07 DIAGNOSIS — M25519 Pain in unspecified shoulder: Secondary | ICD-10-CM | POA: Diagnosis not present

## 2013-06-08 DIAGNOSIS — Z7901 Long term (current) use of anticoagulants: Secondary | ICD-10-CM | POA: Diagnosis not present

## 2013-06-08 DIAGNOSIS — E119 Type 2 diabetes mellitus without complications: Secondary | ICD-10-CM | POA: Diagnosis not present

## 2013-06-08 DIAGNOSIS — I4891 Unspecified atrial fibrillation: Secondary | ICD-10-CM | POA: Diagnosis not present

## 2013-06-09 DIAGNOSIS — M25519 Pain in unspecified shoulder: Secondary | ICD-10-CM | POA: Diagnosis not present

## 2013-06-13 DIAGNOSIS — M25519 Pain in unspecified shoulder: Secondary | ICD-10-CM | POA: Diagnosis not present

## 2013-06-14 ENCOUNTER — Encounter: Payer: Self-pay | Admitting: Cardiovascular Disease

## 2013-06-14 ENCOUNTER — Ambulatory Visit (INDEPENDENT_AMBULATORY_CARE_PROVIDER_SITE_OTHER): Payer: Medicare Other | Admitting: Cardiovascular Disease

## 2013-06-14 ENCOUNTER — Encounter (HOSPITAL_COMMUNITY): Payer: Self-pay | Admitting: Cardiovascular Disease

## 2013-06-14 VITALS — BP 150/56 | HR 72 | Resp 20 | Ht 62.5 in | Wt 163.2 lb

## 2013-06-14 DIAGNOSIS — I798 Other disorders of arteries, arterioles and capillaries in diseases classified elsewhere: Secondary | ICD-10-CM

## 2013-06-14 DIAGNOSIS — I2510 Atherosclerotic heart disease of native coronary artery without angina pectoris: Secondary | ICD-10-CM | POA: Diagnosis not present

## 2013-06-14 DIAGNOSIS — I4891 Unspecified atrial fibrillation: Secondary | ICD-10-CM | POA: Diagnosis not present

## 2013-06-14 DIAGNOSIS — Z950 Presence of cardiac pacemaker: Secondary | ICD-10-CM | POA: Diagnosis not present

## 2013-06-14 DIAGNOSIS — I447 Left bundle-branch block, unspecified: Secondary | ICD-10-CM

## 2013-06-14 DIAGNOSIS — I739 Peripheral vascular disease, unspecified: Secondary | ICD-10-CM

## 2013-06-14 DIAGNOSIS — E1151 Type 2 diabetes mellitus with diabetic peripheral angiopathy without gangrene: Secondary | ICD-10-CM

## 2013-06-14 DIAGNOSIS — E1159 Type 2 diabetes mellitus with other circulatory complications: Secondary | ICD-10-CM

## 2013-06-14 DIAGNOSIS — E785 Hyperlipidemia, unspecified: Secondary | ICD-10-CM

## 2013-06-14 LAB — PACEMAKER DEVICE OBSERVATION

## 2013-06-14 NOTE — Patient Instructions (Addendum)
Your physician recommends that you schedule a follow-up appointment in: 3 months with Dr.Croitoru +pacer check  Your physician has requested that you have a lower  extremity arterial duplex. This test is an ultrasound of the arteries in the legs. It looks at arterial blood flow in the legs. Allow one hour for Lower Arterial scans. There are no restrictions or special instructions.

## 2013-06-14 NOTE — Assessment & Plan Note (Signed)
History of paroxysmal atrial fibrillation without any recurrences since last pacemaker check. On appropriate warfarin anticoagulation which is monitored by Dr. Margo Aye. No bleeding complications and no history of embolic events.

## 2013-06-14 NOTE — Assessment & Plan Note (Signed)
She is at risk of developing high-grade A-V block but already has a dual-chamber pacemaker in place. No signs of congestive heart failure to warrant evaluation for resynchronization.

## 2013-06-14 NOTE — Assessment & Plan Note (Signed)
She has a moderate to severe (70-99%) stenosis in the left mid to distal superficial femoral artery but does not appear to have intermittent claudication. Monitor this on a yearly basis. If she develops symptoms she'll be a good candidate for percutaneous revascularization.

## 2013-06-14 NOTE — Assessment & Plan Note (Signed)
Normally functioning dual-chamber permanent pacemaker with a roughly 3 years of expected battery longevity. No meaningful tachyarrhythmia has been recorded. Almost 100% atrial pacing and no ventricular pacing. Lead parameters are excellent. No permanent reprogramming changes were made. She is a good candidate for remote pacemaker monitoring but seems to be uncomfortable with new technology. Will see her in the office in 3 months and revisit the issue at that time.

## 2013-06-14 NOTE — Assessment & Plan Note (Signed)
Lipid profile in May was generally satisfactory but still had borderline high triglycerides. These are likely to have worsened since glycemic control is now also worse. Will get the more recent lab results performed in Dr. Scharlene Gloss office.

## 2013-06-14 NOTE — Assessment & Plan Note (Signed)
He does not have angina pectoris and a very recent nuclear perfusion study showed lower spines with inferior and anteroapical scars with minimal peri-infarct ischemia. She has mildly depressed left ventricular systolic function but no manifestations of congestive heart failure and no history of ventricular arrhythmia. No changes are made to her medications. She is on beta blocker therapy. Note that she has a history of cough with ACE inhibitors and that her valsartan was stopped because of hypotension in 2011. Normal renal function

## 2013-06-14 NOTE — Progress Notes (Signed)
Patient ID: Megan Haley, female   DOB: Sep 05, 1931, 79 y.o.   MRN: [id number]     Reason for office visit CAD status post CABG, atrial fibrillation, permanent pacemaker  Mr. Clinkscale was previously Dr. Rocco Serene patient until his recent retirement. She had a myocardial infarction in 1993 and underwent multivessel bypass surgery in [place]. She had three-vessel bypass surgery (repeat cardiac catheterization in 2007 showed 80% proximal LAD, totally occluded circumflex, totally occluded RCA, patent LIMA to LAD, patent SVG to OM, total occlusion of the SVG to RCA, LVEF 40% with inferior akinesia sent anteroapical hypokinesis). A nuclear stress test performed in June of this year showed inferior and anteroapical scar with minimal peri-infarct ischemia. EF by scintigraphy was 51%  She received a dual-chamber permanent pacemaker in 2009 for sinus node dysfunction with symptomatic bradycardia. She has a chronic LBBB. She also has peripheral arterial disease of the lower extremities with a high-grade mid to distal left SFA stenosis estimated to be 70-99% in severity by her last duplex ultrasound in January of this year. She has mild nonobstructive carotid atherosclerosis (she has a high-grade stenosis in the left external carotid).  She states that she is feeling great and has no cardiac complaints at this time.  No Known Allergies  Current Outpatient Prescriptions  Medication Sig Dispense Refill  . Biotin 1000 MCG tablet Take 1,000 mcg by mouth daily.      . Insulin Glulisine (APIDRA Megan Haley) Inject 3-7 Units into the skin 3 (three) times daily.      . Choline Fenofibrate (FENOFIBRIC ACID) 135 MG CPDR Take 135 mg by mouth daily.      Marland Kitchen diltiazem (CARDIZEM CD) 120 MG 24 hr capsule Take 120 mg by mouth daily.      Marland Kitchen LANTUS 100 UNIT/ML injection Inject 38-42 Units into the skin daily.      . metoprolol succinate (TOPROL-XL) 25 MG 24 hr tablet Take 25 mg by mouth daily.      . sertraline  (ZOLOFT) 50 MG tablet Take 50 mg by mouth daily.      . simvastatin (ZOCOR) 20 MG tablet Take 20 mg by mouth daily.      Marland Kitchen warfarin (COUMADIN) 2 MG tablet Take 2 mg by mouth daily.       No current facility-administered medications for this visit.    No past medical history on file.  No past surgical history on file.  No family history on file.  History   Social History  . Marital Status: Single    Spouse Name: N/A    Number of Children: N/A  . Years of Education: N/A   Occupational History  . Not on file.   Social History Main Topics  . Smoking status: Former Smoker    Quit date: 11/28/1990  . Smokeless tobacco: Not on file  . Alcohol Use: No  . Drug Use: No  . Sexual Activity: Not on file   Other Topics Concern  . Not on file   Social History Narrative  . No narrative on file    Review of systems: The patient specifically denies any chest pain at rest or with exertion, dyspnea at rest or with exertion, orthopnea, paroxysmal nocturnal dyspnea, syncope, palpitations, focal neurological deficits, intermittent claudication, lower extremity edema, unexplained weight gain, cough, hemoptysis or wheezing.  The patient also denies abdominal pain, nausea, vomiting, dysphagia, diarrhea, constipation, polyuria, polydipsia, dysuria, hematuria, frequency, urgency, abnormal bleeding or bruising, fever, chills, unexpected weight changes, mood swings,  change in skin or hair texture, change in voice quality, auditory or visual problems, allergic reactions or rashes, new musculoskeletal complaints other than usual "aches and pains".   PHYSICAL EXAM BP 150/56  Pulse 72  Ht 5' 2.5" (1.588 m)  Wt 163 lb 3.2 oz (74.027 kg)  BMI 29.36 kg/m2  General: Alert, oriented x3, no distress Head: no evidence of trauma, PERRL, EOMI, no exophtalmos or lid lag, no myxedema, no xanthelasma; normal ears, nose and oropharynx Neck: normal jugular venous pulsations and no hepatojugular reflux; brisk  carotid pulses without delay and bilateral carotid bruits left greater than right Chest: clear to auscultation, no signs of consolidation by percussion or palpation, normal fremitus, symmetrical and full respiratory excursions; healthy left subclavian pacemaker site Cardiovascular: normal position and quality of the apical impulse, regular rhythm, normal first and paradoxically split second heart sounds, no rubs or gallops, 2/6 early peaking systolic ejection murmur in the aortic focus Abdomen: no tenderness or distention, no masses by palpation, no abnormal pulsatility or arterial bruits, normal bowel sounds, no hepatosplenomegaly Extremities: no clubbing, cyanosis or edema; 2+ radial, ulnar and brachial pulses bilaterally; 2+ right femoral, posterior tibial and dorsalis pedis pulses; 2+ left femoral, weak posterior tibial and dorsalis pedis pulses; no subclavian or femoral bruits Neurological: grossly nonfocal   EKG: Atrial paced ventricular sensed left bundle branch block  Labs Total cholesterol 130, triglycerides 156, HDL 39, LDL 60, hemoglobin A1c 8.3%, creatinine 1.06, potassium 4.9   ASSESSMENT AND PLAN Pacemaker - dual chamber Medtronic adapta 2009 Normally functioning dual-chamber permanent pacemaker with a roughly 3 years of expected battery longevity. No meaningful tachyarrhythmia has been recorded. Almost 100% atrial pacing and no ventricular pacing. Lead parameters are excellent. No permanent reprogramming changes were made. She is a good candidate for remote pacemaker monitoring but seems to be uncomfortable with new technology. Will see her in the office in 3 months and revisit the issue at that time.  Atrial fibrillation History of paroxysmal atrial fibrillation without any recurrences since last pacemaker check. On appropriate warfarin anticoagulation which is monitored by Dr. Margo Aye. No bleeding complications and no history of embolic events.  DM (diabetes mellitus) with  peripheral vascular complication She appears to have significant small vessel disease with low TBI in both feet. Needs to be acutely aware of risk of toe ulcer/gangrene and avoid injury to her feet. Diabetes control is currently poor with an A1c of 10%. She has just restarted short acting insulin in addition to her Lantus.  Hyperlipidemia Lipid profile in May was generally satisfactory but still had borderline high triglycerides. These are likely to have worsened since glycemic control is now also worse. Will get the more recent lab results performed in Dr. Scharlene Gloss office.  LBBB (left bundle branch block) She is at risk of developing high-grade A-V block but already has a dual-chamber pacemaker in place. No signs of congestive heart failure to warrant evaluation for resynchronization.  PAD (peripheral artery disease)  She has a moderate to severe (70-99%) stenosis in the left mid to distal superficial femoral artery but does not appear to have intermittent claudication. Monitor this on a yearly basis. If she develops symptoms she'll be a good candidate for percutaneous revascularization.  CAD s/p CABG 1993 He does not have angina pectoris and a very recent nuclear perfusion study showed lower spines with inferior and anteroapical scars with minimal peri-infarct ischemia. She has mildly depressed left ventricular systolic function but no manifestations of congestive heart failure and no history  of ventricular arrhythmia. No changes are made to her medications. She is on beta blocker therapy. Note that she has a history of cough with ACE inhibitors and that her valsartan was stopped because of hypotension in 2011. Normal renal function  Orders Placed This Encounter  Procedures  . EKG 12-Lead  . Lower Extremity Arterial Doppler Bilateral   Meds ordered this encounter  Medications  . Choline Fenofibrate (FENOFIBRIC ACID) 135 MG CPDR    Sig: Take 135 mg by mouth daily.  Marland Kitchen diltiazem (CARDIZEM CD)  120 MG 24 hr capsule    Sig: Take 120 mg by mouth daily.  Marland Kitchen LANTUS 100 UNIT/ML injection    Sig: Inject 38-42 Units into the skin daily.  . metoprolol succinate (TOPROL-XL) 25 MG 24 hr tablet    Sig: Take 25 mg by mouth daily.  . sertraline (ZOLOFT) 50 MG tablet    Sig: Take 50 mg by mouth daily.  . simvastatin (ZOCOR) 20 MG tablet    Sig: Take 20 mg by mouth daily.  Marland Kitchen warfarin (COUMADIN) 2 MG tablet    Sig: Take 2 mg by mouth daily.  . Insulin Glulisine (APIDRA [place])    Sig: Inject 3-7 Units into the skin 3 (three) times daily.  . Biotin 1000 MCG tablet    Sig: Take 1,000 mcg by mouth daily.    Junious Silk, MD, [facility] [phone] office [phone] pager

## 2013-06-14 NOTE — Assessment & Plan Note (Signed)
She appears to have significant small vessel disease with low TBI in both feet. Needs to be acutely aware of risk of toe ulcer/gangrene and avoid injury to her feet. Diabetes control is currently poor with an A1c of 10%. She has just restarted short acting insulin in addition to her Lantus.

## 2013-06-16 ENCOUNTER — Encounter: Payer: Self-pay | Admitting: Cardiovascular Disease

## 2013-06-17 DIAGNOSIS — M25519 Pain in unspecified shoulder: Secondary | ICD-10-CM | POA: Diagnosis not present

## 2013-06-19 DIAGNOSIS — M25519 Pain in unspecified shoulder: Secondary | ICD-10-CM | POA: Diagnosis not present

## 2013-06-20 ENCOUNTER — Encounter: Payer: Self-pay | Admitting: Cardiovascular Disease

## 2013-06-21 DIAGNOSIS — M25519 Pain in unspecified shoulder: Secondary | ICD-10-CM | POA: Diagnosis not present

## 2013-06-22 ENCOUNTER — Telehealth: Payer: Self-pay | Admitting: Internal Medicine

## 2013-06-22 NOTE — Telephone Encounter (Signed)
Pt had an appt with Dr C on  06-14-13/mt

## 2013-06-27 DIAGNOSIS — M25519 Pain in unspecified shoulder: Secondary | ICD-10-CM | POA: Diagnosis not present

## 2013-06-29 DIAGNOSIS — M25519 Pain in unspecified shoulder: Secondary | ICD-10-CM | POA: Diagnosis not present

## 2013-07-04 ENCOUNTER — Other Ambulatory Visit: Payer: Self-pay | Admitting: *Deleted

## 2013-07-04 DIAGNOSIS — M25519 Pain in unspecified shoulder: Secondary | ICD-10-CM | POA: Diagnosis not present

## 2013-07-04 MED ORDER — DILTIAZEM HCL ER COATED BEADS 120 MG PO CP24: 120.0000 mg | ORAL_CAPSULE | Freq: Every day | ORAL | Status: DC

## 2013-07-04 NOTE — Telephone Encounter (Signed)
Rx was sent to pharmacy electronically. 

## 2013-07-06 DIAGNOSIS — M25519 Pain in unspecified shoulder: Secondary | ICD-10-CM | POA: Diagnosis not present

## 2013-07-11 DIAGNOSIS — M25519 Pain in unspecified shoulder: Secondary | ICD-10-CM | POA: Diagnosis not present

## 2013-07-14 DIAGNOSIS — M25519 Pain in unspecified shoulder: Secondary | ICD-10-CM | POA: Diagnosis not present

## 2013-07-15 ENCOUNTER — Encounter (HOSPITAL_COMMUNITY): Payer: Self-pay | Admitting: Pharmacy Technician

## 2013-07-19 ENCOUNTER — Encounter (HOSPITAL_COMMUNITY): Payer: Self-pay | Admitting: *Deleted

## 2013-07-19 ENCOUNTER — Encounter (HOSPITAL_COMMUNITY)
Admission: RE | Disposition: A | Discharge status: Home / Self Care | Payer: Self-pay | Source: Ambulatory Visit | Attending: Ophthalmology

## 2013-07-19 ENCOUNTER — Ambulatory Visit (HOSPITAL_COMMUNITY)
Admission: RE | Admit: 2013-07-19 | Discharge: 2013-07-19 | Disposition: A | Discharge status: Home / Self Care | Payer: Medicare Other | Source: Ambulatory Visit | Attending: Ophthalmology | Admitting: Ophthalmology

## 2013-07-19 DIAGNOSIS — H26499 Other secondary cataract, unspecified eye: Secondary | ICD-10-CM | POA: Insufficient documentation

## 2013-07-19 HISTORY — PX: YAG LASER APPLICATION: SHX6189

## 2013-07-19 SURGERY — TREATMENT, USING YAG LASER
Anesthesia: Topical | Laterality: Right

## 2013-07-19 MED ORDER — TROPICAMIDE 1 % OP SOLN
OPHTHALMIC | Status: AC
  Filled 2013-07-19: qty 3

## 2013-07-19 MED ORDER — CYCLOPENTOLATE-PHENYLEPHRINE OP SOLN OPTIME - NO CHARGE
OPHTHALMIC | Status: AC
  Filled 2013-07-19: qty 2

## 2013-07-19 MED ORDER — TROPICAMIDE 1 % OP SOLN
1.0000 [drp] | OPHTHALMIC | Status: DC | PRN
  Administered 2013-07-19 (×3): 1 [drp] via OPHTHALMIC

## 2013-07-19 NOTE — H&P (Signed)
The patient was re examined and there is no change in the patients condition since the original H and P. 

## 2013-07-19 NOTE — Brief Op Note (Signed)
Megan Haley T. Nile Riggs, MD  Procedure: Yag Capsulotomy  Yag Laser Self Test Completedyes. Procedure: Posterior Capsulotomy, Eye Protection Worn by Staff yes. Laser In Use Sign on Door yes.  Laser: Nd:YAG Spot Size: Fixed Burst Mode: III Power Setting: 3.5 mJ/burst Number of shots: 21 Total energy delivered: 73.8 mJ   The patient tolerated the procedure without difficulty. No complications were encountered.   The patient was discharged home with the instructions to continue all her current glaucoma medications, if any.   Patient instructed to go to office at 1000 for intraocular pressure check.  Patient verbalizes understanding of discharge instructions yes.    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

## 2013-07-26 DIAGNOSIS — M25519 Pain in unspecified shoulder: Secondary | ICD-10-CM | POA: Diagnosis not present

## 2013-07-29 DIAGNOSIS — M25519 Pain in unspecified shoulder: Secondary | ICD-10-CM | POA: Diagnosis not present

## 2013-08-01 DIAGNOSIS — M25519 Pain in unspecified shoulder: Secondary | ICD-10-CM | POA: Diagnosis not present

## 2013-08-03 DIAGNOSIS — Z7901 Long term (current) use of anticoagulants: Secondary | ICD-10-CM | POA: Diagnosis not present

## 2013-08-03 DIAGNOSIS — I4891 Unspecified atrial fibrillation: Secondary | ICD-10-CM | POA: Diagnosis not present

## 2013-08-03 DIAGNOSIS — E119 Type 2 diabetes mellitus without complications: Secondary | ICD-10-CM | POA: Diagnosis not present

## 2013-08-03 DIAGNOSIS — M25519 Pain in unspecified shoulder: Secondary | ICD-10-CM | POA: Diagnosis not present

## 2013-08-03 DIAGNOSIS — I10 Essential (primary) hypertension: Secondary | ICD-10-CM | POA: Diagnosis not present

## 2013-08-05 DIAGNOSIS — E119 Type 2 diabetes mellitus without complications: Secondary | ICD-10-CM | POA: Diagnosis not present

## 2013-08-18 ENCOUNTER — Ambulatory Visit: Payer: Self-pay | Admitting: Pharmacist Clinician (PhC)/ Clinical Pharmacy Specialist

## 2013-08-18 DIAGNOSIS — I4891 Unspecified atrial fibrillation: Secondary | ICD-10-CM

## 2013-08-18 DIAGNOSIS — Z7901 Long term (current) use of anticoagulants: Secondary | ICD-10-CM

## 2013-08-23 ENCOUNTER — Encounter: Payer: Self-pay | Admitting: Cardiovascular Disease

## 2013-08-23 ENCOUNTER — Encounter (HOSPITAL_COMMUNITY): Payer: Medicare Other

## 2013-08-31 DIAGNOSIS — I4891 Unspecified atrial fibrillation: Secondary | ICD-10-CM | POA: Diagnosis not present

## 2013-08-31 DIAGNOSIS — I10 Essential (primary) hypertension: Secondary | ICD-10-CM | POA: Diagnosis not present

## 2013-08-31 DIAGNOSIS — Z7901 Long term (current) use of anticoagulants: Secondary | ICD-10-CM | POA: Diagnosis not present

## 2013-08-31 DIAGNOSIS — E119 Type 2 diabetes mellitus without complications: Secondary | ICD-10-CM | POA: Diagnosis not present

## 2013-09-06 ENCOUNTER — Encounter: Payer: Self-pay | Admitting: Cardiovascular Disease

## 2013-09-06 ENCOUNTER — Ambulatory Visit (INDEPENDENT_AMBULATORY_CARE_PROVIDER_SITE_OTHER): Payer: Medicare Other | Admitting: Cardiovascular Disease

## 2013-09-06 ENCOUNTER — Ambulatory Visit (HOSPITAL_COMMUNITY)
Admission: RE | Admit: 2013-09-06 | Discharge: 2013-09-06 | Disposition: A | Discharge status: Home / Self Care | Payer: Medicare Other | Source: Ambulatory Visit | Attending: Cardiovascular Disease | Admitting: Cardiovascular Disease

## 2013-09-06 VITALS — BP 129/58 | HR 76 | Ht 62.5 in | Wt 160.4 lb

## 2013-09-06 DIAGNOSIS — I739 Peripheral vascular disease, unspecified: Secondary | ICD-10-CM

## 2013-09-06 DIAGNOSIS — I4891 Unspecified atrial fibrillation: Secondary | ICD-10-CM

## 2013-09-06 DIAGNOSIS — I2510 Atherosclerotic heart disease of native coronary artery without angina pectoris: Secondary | ICD-10-CM

## 2013-09-06 DIAGNOSIS — E785 Hyperlipidemia, unspecified: Secondary | ICD-10-CM

## 2013-09-06 DIAGNOSIS — I447 Left bundle-branch block, unspecified: Secondary | ICD-10-CM | POA: Diagnosis not present

## 2013-09-06 DIAGNOSIS — I70209 Unspecified atherosclerosis of native arteries of extremities, unspecified extremity: Secondary | ICD-10-CM | POA: Diagnosis not present

## 2013-09-06 LAB — PACEMAKER DEVICE OBSERVATION

## 2013-09-06 NOTE — Assessment & Plan Note (Signed)
She has not had atrial fibrillation in a while. To avoid hypotension and allow introduction of angiotensin receptor blocker therapy I have advised that she stop the diltiazem. We'll continue metoprolol. Therapeutically anticoagulated with warfarin without complications. Strongly encouraged her to have a pacemaker remote transmitter at home even if she prefers an office device checks. This will allow for earlier diagnosis of arrhythmia she develops palpitations.

## 2013-09-06 NOTE — Patient Instructions (Addendum)
Your physician recommends that you schedule a follow-up appointment in: 3 months with Dr.Croitoru + pacemaker check  Stop DILTIAZEM.  YOU CAN TAKE LOSARTAN 50 MG -1/2 tablet once a day.

## 2013-09-06 NOTE — Progress Notes (Signed)
Lower Extremity Arterial Duplex Completed. °Brianna L Mazza,RVT °

## 2013-09-06 NOTE — Assessment & Plan Note (Addendum)
She reports that glycemic control is improving, hopefully this will also mean her hypertriglyceridemia will be better. Labs to be done in March. Stopping diltiazem will also do away with the potential adverse interaction with simvastatin.

## 2013-09-06 NOTE — Progress Notes (Signed)
Patient ID: Megan Haley, female   DOB: 1931-10-22, 80 y.o.   MRN: [id number]      Reason for office visit Pacemaker check, atrial fibrillation, coronary artery disease  Mrs. Tollett is feeling well. She just spent several weeks in [place] sitting for a friend and taking care of several pet animals. She had to climb up and down stairs repeatedly all day long and did not have any problems with angina or dyspnea or dizziness.  She had a myocardial infarction in 1993 and underwent multivessel bypass surgery in [place]. She had three-vessel bypass surgery (repeat cardiac catheterization in 2007 showed 80% proximal LAD, totally occluded circumflex, totally occluded RCA, patent LIMA to LAD, patent SVG to OM, total occlusion of the SVG to RCA, LVEF 40% with inferior akinesia sent anteroapical hypokinesis). A nuclear stress test performed in June 2014 showed inferior and anteroapical scar with minimal peri-infarct ischemia. EF by scintigraphy was 51%   She received a dual-chamber permanent pacemaker in 2009 for sinus node dysfunction with symptomatic bradycardia. She has a chronic LBBB. She is not pacemaker dependent the pacing the atrium virtually 100% of the time. She does not require ventricular pacing. She prefers to have her pacemaker checked in person in the office on every three-month basis.  She also has peripheral arterial disease of the lower extremities with a high-grade mid to distal left SFA stenosis estimated to be 70-99% in severity by her last duplex ultrasound in January of this year. She has mild nonobstructive carotid atherosclerosis (she has a high-grade stenosis in the left external carotid).   She states that she is feeling great and has no cardiac complaints at this time.  She was prescribed losartan but has not started taking it. She was worried about its potential side effects and did not really understand its purpose. She has a history of cough with ACE  inhibitors and irbesartan was stopped in the past but it produced symptomatic hypotension.   No Known Allergies  Current Outpatient Prescriptions  Medication Sig Dispense Refill  . aspirin EC 81 MG tablet Take 81 mg by mouth daily.      . Biotin 1000 MCG tablet Take 1,000 mcg by mouth daily.      . Cholecalciferol (VITAMIN D-3) 1000 UNITS CAPS Take by mouth.      . Choline Fenofibrate (FENOFIBRIC ACID) 135 MG CPDR Take 135 mg by mouth daily.      Marland Kitchen diltiazem (CARDIZEM CD) 120 MG 24 hr capsule Take 1 capsule (120 mg total) by mouth daily.  30 capsule  10  . Insulin Lispro, Human, (HUMALOG PEN [place]) Inject into the skin. Take 3-7 units      . LANTUS 100 UNIT/ML injection Inject 40 Units into the skin at bedtime.       . metoprolol succinate (TOPROL-XL) 25 MG 24 hr tablet Take 25 mg by mouth daily.      . sertraline (ZOLOFT) 50 MG tablet Take 50 mg by mouth daily.      . simvastatin (ZOCOR) 20 MG tablet Take 20 mg by mouth daily.      Marland Kitchen warfarin (COUMADIN) 2 MG tablet Take 2 mg by mouth daily. Takes 2 mg everyday except on Wed and Sun when she takes 1 mg.      . Blood Glucose Monitoring Suppl (ONE TOUCH ULTRA MINI) W/DEVICE KIT       . losartan (COZAAR) 50 MG tablet Take 50 mg by mouth daily.      Marland Kitchen  ONE TOUCH ULTRA TEST test strip        No current facility-administered medications for this visit.    No past medical history on file.  Past Surgical History  Procedure Laterality Date  . Yag laser application Right 33/00/7622    Procedure: YAG LASER APPLICATION;  Surgeon: Elta Guadeloupe T. Gershon Crane, MD;  Location: [facility];  Service: Ophthalmology;  Laterality: Right;    No family history on file.  History   Social History  . Marital Status: Single    Spouse Name: N/A    Number of Children: N/A  . Years of Education: N/A   Occupational History  . Not on file.   Social History Main Topics  . Smoking status: Former Smoker    Quit date: 11/28/1990  . Smokeless tobacco: Not on file  .  Alcohol Use: No  . Drug Use: No  . Sexual Activity: Not on file   Other Topics Concern  . Not on file   Social History Narrative  . No narrative on file    Review of systems: The patient specifically denies any chest pain at rest or with exertion, dyspnea at rest or with exertion, orthopnea, paroxysmal nocturnal dyspnea, syncope, palpitations, focal neurological deficits, intermittent claudication, lower extremity edema, unexplained weight gain, cough, hemoptysis or wheezing.  The patient also denies abdominal pain, nausea, vomiting, dysphagia, diarrhea, constipation, polyuria, polydipsia, dysuria, hematuria, frequency, urgency, abnormal bleeding or bruising, fever, chills, unexpected weight changes, mood swings, change in skin or hair texture, change in voice quality, auditory or visual problems, allergic reactions or rashes, new musculoskeletal complaints other than usual "aches and pains".   PHYSICAL EXAM BP 129/58  Pulse 76  Ht 5' 2.5" (1.588 m)  Wt 72.757 kg (160 lb 6.4 oz)  BMI 28.85 kg/m2 General: Alert, oriented x3, no distress  Head: no evidence of trauma, PERRL, EOMI, no exophtalmos or lid lag, no myxedema, no xanthelasma; normal ears, nose and oropharynx  Neck: normal jugular venous pulsations and no hepatojugular reflux; brisk carotid pulses without delay and bilateral carotid bruits left greater than right  Chest: clear to auscultation, no signs of consolidation by percussion or palpation, normal fremitus, symmetrical and full respiratory excursions; healthy left subclavian pacemaker site  Cardiovascular: normal position and quality of the apical impulse, regular rhythm, normal first and paradoxically split second heart sounds, no rubs or gallops, 2/6 early peaking systolic ejection murmur in the aortic focus  Abdomen: no tenderness or distention, no masses by palpation, no abnormal pulsatility or arterial bruits, normal bowel sounds, no hepatosplenomegaly  Extremities: no  clubbing, cyanosis or edema; 2+ radial, ulnar and brachial pulses bilaterally; 2+ right femoral, posterior tibial and dorsalis pedis pulses; 2+ left femoral, weak posterior tibial and dorsalis pedis pulses; no subclavian or femoral bruits  Neurological: grossly nonfocal   EKG: Atrial paced ventricular sensed left bundle branch block 142 ms  A full in office pacemaker check today shows normal device function. 100% atrial pacing with fairly blunted heart rate histograms (but she is asymptomatic) and no ventricular pacing. Underlying rhythm is severe sinus bradycardia in the 30s. There have been no episodes of atrial fibrillation or ventricular high rates.  Lipid Panel  due to have a full panel of labs in March with Dr. [place] Crane  BMET    Component Value Date/Time   NA 138 11/29/2010 0930   K 4.9 11/29/2010 0930   CL 103 11/29/2010 0930   CO2 30 11/29/2010 0930   GLUCOSE 121* 11/29/2010 0930  BUN 27* 11/29/2010 0930   CREATININE 0.87 11/29/2010 0930   CALCIUM 9.9 11/29/2010 0930   GFRNONAA >60 11/29/2010 0930   GFRAA  Value: >60        The eGFR has been calculated using the MDRD equation. This calculation has not been validated in all clinical situations. eGFR's persistently <60 mL/min signify possible Chronic Kidney Disease. 11/29/2010 0930     ASSESSMENT AND PLAN Atrial fibrillation She has not had atrial fibrillation in a while. To avoid hypotension and allow introduction of angiotensin receptor blocker therapy I have advised that she stop the diltiazem. We'll continue metoprolol. Therapeutically anticoagulated with warfarin without complications. Strongly encouraged her to have a pacemaker remote transmitter at home even if she prefers an office device checks. This will allow for earlier diagnosis of arrhythmia she develops palpitations.  Hyperlipidemia She reports that glycemic control is improving, hopefully this will also mean her hypertriglyceridemia will be better. Labs to be done in March. Stopping  diltiazem will also do away with the potential adverse interaction with simvastatin.  CAD s/p CABG 1993 No symptoms of angina or congestive heart failure. She has a known chronic total occlusion of the right coronary artery with an occluded vein graft to that vessel, inferior scar and anterior apical scar with an ejection fraction estimated around 45%. She would benefit from ARB therapy from a cardiomyopathy perspective as well. Stopping diltiazem is also wise to avoid its negative inotropic effects. If she does develop atrial fibrillation rapid ventricular response, rather than reintroducing diltiazem I would recommend boosting the dose of metoprolol.  PAD (peripheral artery disease) She has an asymptomatic moderate to severe stenosis in the left superficial femoral artery. Plan reevaluation with duplex ultrasound later this year   Orders Placed This Encounter  Procedures  . EKG 12-Lead   Meds ordered this encounter  Medications  . Cholecalciferol (VITAMIN D-3) 1000 UNITS CAPS    Sig: Take by mouth.  . Insulin Lispro, Human, (HUMALOG PEN [place])    Sig: Inject into the skin. Take 3-7 units  . ONE TOUCH ULTRA TEST test strip    Sig:   . Blood Glucose Monitoring Suppl (ONE TOUCH ULTRA MINI) W/DEVICE KIT    Sig:   . losartan (COZAAR) 50 MG tablet    Sig: Take 50 mg by mouth daily.  Marland Kitchen DISCONTD: lisinopril (PRINIVIL,ZESTRIL) 10 MG tablet    Sig:     Holli Humbles, MD, [facility] [phone] office [phone] pager

## 2013-09-06 NOTE — Assessment & Plan Note (Signed)
No symptoms of angina or congestive heart failure. She has a known chronic total occlusion of the right coronary artery with an occluded vein graft to that vessel, inferior scar and anterior apical scar with an ejection fraction estimated around 45%. She would benefit from ARB therapy from a cardiomyopathy perspective as well. Stopping diltiazem is also wise to avoid its negative inotropic effects. If she does develop atrial fibrillation rapid ventricular response, rather than reintroducing diltiazem I would recommend boosting the dose of metoprolol.

## 2013-09-06 NOTE — Assessment & Plan Note (Signed)
She has an asymptomatic moderate to severe stenosis in the left superficial femoral artery. Plan reevaluation with duplex ultrasound later this year

## 2013-09-16 LAB — MDC_IDC_ENUM_SESS_TYPE_INCLINIC
Battery Impedance: 1694 Ohm
Brady Statistic AP VP Percent: 0 %
Brady Statistic AS VP Percent: 0 %
Brady Statistic AS VS Percent: 0 %
Date Time Interrogation Session: 20150210150640
Lead Channel Impedance Value: 515 Ohm
Lead Channel Impedance Value: 537 Ohm
Lead Channel Pacing Threshold Amplitude: 0.75 V
Lead Channel Pacing Threshold Pulse Width: 0.4 ms
Lead Channel Pacing Threshold Pulse Width: 0.4 ms
Lead Channel Sensing Intrinsic Amplitude: 15.67 mV
Lead Channel Sensing Intrinsic Amplitude: 2.8 mV
MDC IDC MSMT BATTERY REMAINING LONGEVITY: 30 mo
MDC IDC MSMT BATTERY VOLTAGE: 2.74 V
MDC IDC MSMT LEADCHNL RV PACING THRESHOLD AMPLITUDE: 1 V
MDC IDC SET LEADCHNL RA PACING AMPLITUDE: 2 V
MDC IDC SET LEADCHNL RV PACING AMPLITUDE: 2.5 V
MDC IDC SET LEADCHNL RV PACING PULSEWIDTH: 0.4 ms
MDC IDC SET LEADCHNL RV SENSING SENSITIVITY: 5.6 mV
MDC IDC STAT BRADY AP VS PERCENT: 100 %

## 2013-09-28 DIAGNOSIS — I4891 Unspecified atrial fibrillation: Secondary | ICD-10-CM | POA: Diagnosis not present

## 2013-09-28 DIAGNOSIS — E785 Hyperlipidemia, unspecified: Secondary | ICD-10-CM | POA: Diagnosis not present

## 2013-09-28 DIAGNOSIS — Z7901 Long term (current) use of anticoagulants: Secondary | ICD-10-CM | POA: Diagnosis not present

## 2013-09-28 DIAGNOSIS — E119 Type 2 diabetes mellitus without complications: Secondary | ICD-10-CM | POA: Diagnosis not present

## 2013-11-30 ENCOUNTER — Encounter: Payer: Self-pay | Admitting: *Deleted

## 2013-12-06 ENCOUNTER — Encounter: Payer: Self-pay | Admitting: Cardiovascular Disease

## 2013-12-06 ENCOUNTER — Ambulatory Visit (INDEPENDENT_AMBULATORY_CARE_PROVIDER_SITE_OTHER): Payer: Medicare Other | Admitting: Cardiovascular Disease

## 2013-12-06 VITALS — BP 122/74 | HR 76 | Resp 16 | Ht 62.5 in | Wt 160.3 lb

## 2013-12-06 DIAGNOSIS — I2510 Atherosclerotic heart disease of native coronary artery without angina pectoris: Secondary | ICD-10-CM

## 2013-12-06 DIAGNOSIS — Z950 Presence of cardiac pacemaker: Secondary | ICD-10-CM | POA: Diagnosis not present

## 2013-12-06 DIAGNOSIS — E1159 Type 2 diabetes mellitus with other circulatory complications: Secondary | ICD-10-CM

## 2013-12-06 DIAGNOSIS — I739 Peripheral vascular disease, unspecified: Secondary | ICD-10-CM

## 2013-12-06 DIAGNOSIS — I4891 Unspecified atrial fibrillation: Secondary | ICD-10-CM

## 2013-12-06 DIAGNOSIS — E785 Hyperlipidemia, unspecified: Secondary | ICD-10-CM

## 2013-12-06 DIAGNOSIS — E1151 Type 2 diabetes mellitus with diabetic peripheral angiopathy without gangrene: Secondary | ICD-10-CM

## 2013-12-06 LAB — MDC_IDC_ENUM_SESS_TYPE_INCLINIC
Battery Voltage: 2.74 V
Brady Statistic AP VP Percent: 0.2 %
Brady Statistic AP VS Percent: 99.1 %
Brady Statistic AS VP Percent: 0.1 % — CL
Lead Channel Impedance Value: 487 Ohm
Lead Channel Impedance Value: 512 Ohm
Lead Channel Pacing Threshold Amplitude: 0.5 V
Lead Channel Pacing Threshold Amplitude: 0.5 V
Lead Channel Sensing Intrinsic Amplitude: 15.68 mV
Lead Channel Sensing Intrinsic Amplitude: 2.8 mV
Lead Channel Setting Pacing Amplitude: 2 V
Lead Channel Setting Pacing Amplitude: 2.5 V
Lead Channel Setting Pacing Pulse Width: 0.4 ms
MDC IDC MSMT BATTERY IMPEDANCE: 1814 Ohm
MDC IDC MSMT LEADCHNL RA PACING THRESHOLD PULSEWIDTH: 0.4 ms
MDC IDC MSMT LEADCHNL RV PACING THRESHOLD PULSEWIDTH: 0.4 ms
MDC IDC SET LEADCHNL RV SENSING SENSITIVITY: 5.6 mV
MDC IDC STAT BRADY AS VS PERCENT: 0.7 %

## 2013-12-06 LAB — PACEMAKER DEVICE OBSERVATION

## 2013-12-06 LAB — POCT INR: INR: 2.6

## 2013-12-06 NOTE — Patient Instructions (Addendum)
Remote monitoring is used to monitor your pacemaker from home. This monitoring reduces the number of office visits required to check your device to one time per year. It allows [place] to keep an eye on the functioning of your device to ensure it is working properly. You are scheduled for a device check from home on 03-09-2014. You may send your transmission at any time that day. If you have a wireless device, the transmission will be sent automatically. After your physician reviews your transmission, you will receive a postcard with your next transmission date.  Dr Sallyanne Kuster has made NO CHANGES today in your current medications.  Your physician recommends that you schedule a follow-up appointment in: 6 months with Dr.Croitoru

## 2013-12-06 NOTE — Progress Notes (Signed)
Patient ID: Megan Haley, female   DOB: 1932/03/17, 80 y.o.   MRN: [id number]     Reason for office visit Pacemaker check, atrial fibrillation, coronary artery disease   Megan Haley is feeling well. She denies angina or dyspnea and is unaware of any rhythm irregularities. Pacemaker interrogation shows brief atrial tachycardia spells, no recent atrial fibrillation. :abs were checked by Dr. Margo Aye in March and were "good".  She is taking an ACEi for renal protection in IDDM and metoprolol for arrhythmia - she does not truly have HTN.  She had a myocardial infarction in 1993 and underwent multivessel bypass surgery in [place]. She had three-vessel bypass surgery (repeat cardiac catheterization in 2007 showed 80% proximal LAD, totally occluded circumflex, totally occluded RCA, patent LIMA to LAD, patent SVG to OM, total occlusion of the SVG to RCA, LVEF 40% with inferior akinesia sent anteroapical hypokinesis). A nuclear stress test performed in June 2014 showed inferior and anteroapical scar with minimal peri-infarct ischemia. EF by scintigraphy was 51%   She received a dual-chamber permanent pacemaker in 2009 for sinus node dysfunction with symptomatic bradycardia. She has a chronic LBBB. She is not pacemaker dependent, but has pacing in the atrium virtually 100% of the time. She does not require ventricular pacing.   She also has peripheral arterial disease of the lower extremities with a high-grade mid to distal left SFA stenosis estimated to be 70-99% in severity by her last duplex ultrasound in January of this year. She has mild nonobstructive carotid atherosclerosis (she has a high-grade stenosis in the left external carotid).     Allergies  Allergen Reactions  . Lisinopril Cough    Dizziness    Current Outpatient Prescriptions  Medication Sig Dispense Refill  . aspirin EC 81 MG tablet Take 81 mg by mouth daily.      . Biotin 1000 MCG tablet Take 1,000 mcg by mouth daily.       . Blood Glucose Monitoring Suppl (ONE TOUCH ULTRA MINI) W/DEVICE KIT       . Cholecalciferol (VITAMIN D-3) 1000 UNITS CAPS Take by mouth.      . Choline Fenofibrate (FENOFIBRIC ACID) 135 MG CPDR Take 135 mg by mouth daily.      . Insulin Lispro, Human, (HUMALOG PEN [place]) Inject into the skin. Take 3-7 units      . LANTUS 100 UNIT/ML injection Inject 40 Units into the skin at bedtime.       Marland Kitchen lisinopril (PRINIVIL,ZESTRIL) 10 MG tablet Take 0.5 tablets by mouth daily.      . metoprolol succinate (TOPROL-XL) 25 MG 24 hr tablet Take 25 mg by mouth daily.      . ONE TOUCH ULTRA TEST test strip       . sertraline (ZOLOFT) 50 MG tablet Take 50 mg by mouth daily.      . simvastatin (ZOCOR) 20 MG tablet Take 20 mg by mouth daily.      Marland Kitchen warfarin (COUMADIN) 2 MG tablet Take 2 mg by mouth daily. Takes 2 mg everyday except on Wed and Sun when she takes 1 mg.       No current facility-administered medications for this visit.    Past Medical History  Diagnosis Date  . MI (myocardial infarction) 1993    CABG in [place]  . Pacemaker   . CAD (coronary artery disease)   . PAD (peripheral artery disease)   . IDDM (insulin dependent diabetes mellitus)   . Hypertension   .  Former smoker   . Bladder cancer     surgery & chemo  . Hyperlipidemia   . PAF (paroxysmal atrial fibrillation)     Past Surgical History  Procedure Laterality Date  . Yag laser application Right 69/62/9528    Procedure: YAG LASER APPLICATION;  Surgeon: Elta Guadeloupe T. Gershon Crane, MD;  Location: [facility];  Service: Ophthalmology;  Laterality: Right;  . Bladder repair  1990  . Abdominal hysterectomy  1900  . Bladder surgery  2007    r/t bladder cancer  . [place]  - LIMA to LAD, VG to PDA, VG and marginal   . Transthoracic echocardiogram  01/2013    EF 40-45%, mild LVH; ventricular spetum with abnormal function & dysynergy; calcified MV annulus, mild MR; PA peak pressure 15mmHg  . Nm myocar perf wall  motion  12/2012    lexiscan - low riskwith medium-large size, severe in intensity fixed defect consistent with previous infarction & small size, mild intensity fixed defect in distal anterior to anteroapical region consistent with previous infarction  . Cardiac catheterization  04/03/2006    [facility] - L main diffusely disease, LAD w/50-60% diffuse disease and 80% lesion in prox/mid region; LCfx totally occluded proximally; RCA with 70% prox lesion after which RCA was totally occluded; patent LIMA-LAD; VG to RCA occluded; EF 40% (Dr. Marisa Cyphers)  . Upper extremity arterial doppler  01/2013    non-compressible calcified bilateral brachial pressure, bilat digits WNL  . Carotid doppler  07/2012    mild amount of fibrous plaque in bilateral bulb/prox ICA; LECA with elevated velocities  . Lower extremity arterial doppler  07/2012    moderate to severe arterial insufficiency, diffuse plaque in R SFA & less than 50% diameter reduction; L SFA with diffuse plaque & 70-99% diameter reduction  . Pacemaker insertion  07/30/2007    [employer] Adapta ADDR01 - Dr. Gerrie Nordmann - symptomatic bradycardia  . Carpal tunnel release      Family History  Problem Relation Age of Onset  . Hypertension Mother   . Skin cancer Mother   . Pneumonia Mother   . Diabetes Father   . Heart attack Father 28  . Appendicitis Maternal Grandmother   . Diabetes Paternal Grandmother   . Heart disease Paternal Grandmother   . Asthma Paternal Grandfather   . Diabetes Sister   . Bladder Cancer Sister   . Hypertension Child     History   Social History  . Marital Status: Single    Spouse Name: N/A    Number of Children: 3  . Years of Education: 12   Occupational History  . Not on file.   Social History Main Topics  . Smoking status: Former Smoker -- 38 years    Types: Cigarettes    Quit date: 11/28/1990  . Smokeless tobacco: Not on file  . Alcohol Use: No  . Drug Use: No  . Sexual Activity: Not on file    Other Topics Concern  . Not on file   Social History Narrative  . No narrative on file    Review of systems: The patient specifically denies any chest pain at rest or with exertion, dyspnea at rest or with exertion, orthopnea, paroxysmal nocturnal dyspnea, syncope, palpitations, focal neurological deficits, intermittent claudication, lower extremity edema, unexplained weight gain, cough, hemoptysis or wheezing.  The patient also denies abdominal pain, nausea, vomiting, dysphagia, diarrhea, constipation, polyuria, polydipsia, dysuria, hematuria, frequency, urgency, abnormal  bleeding or bruising, fever, chills, unexpected weight changes, mood swings, change in skin or hair texture, change in voice quality, auditory or visual problems, allergic reactions or rashes, new musculoskeletal complaints other than usual "aches and pains".   PHYSICAL EXAM BP 122/74  Pulse 76  Resp 16  Ht 5' 2.5" (1.588 m)  Wt 160 lb 4.8 oz (72.712 kg)  BMI 28.83 kg/m2 General: Alert, oriented x3, no distress  Head: no evidence of trauma, PERRL, EOMI, no exophtalmos or lid lag, no myxedema, no xanthelasma; normal ears, nose and oropharynx  Neck: normal jugular venous pulsations and no hepatojugular reflux; brisk carotid pulses without delay and bilateral carotid bruits left greater than right  Chest: clear to auscultation, no signs of consolidation by percussion or palpation, normal fremitus, symmetrical and full respiratory excursions; healthy left subclavian pacemaker site  Cardiovascular: normal position and quality of the apical impulse, regular rhythm, normal first and paradoxically split second heart sounds, no rubs or gallops, 2/6 early peaking systolic ejection murmur in the aortic focus  Abdomen: no tenderness or distention, no masses by palpation, no abnormal pulsatility or arterial bruits, normal bowel sounds, no hepatosplenomegaly  Extremities: no clubbing, cyanosis or edema; 2+ radial, ulnar and brachial  pulses bilaterally; 2+ right femoral, posterior tibial and dorsalis pedis pulses; 2+ left femoral, weak posterior tibial and dorsalis pedis pulses; no subclavian or femoral bruits  Neurological: grossly nonfocal   EKG: Atrial paced ventricular sensed left bundle branch block  BMET    Component Value Date/Time   NA 138 11/29/2010 0930   K 4.9 11/29/2010 0930   CL 103 11/29/2010 0930   CO2 30 11/29/2010 0930   GLUCOSE 121* 11/29/2010 0930   BUN 27* 11/29/2010 0930   CREATININE 0.87 11/29/2010 0930   CALCIUM 9.9 11/29/2010 0930   GFRNONAA >60 11/29/2010 0930   GFRAA  Value: >60        The eGFR has been calculated using the MDRD equation. This calculation has not been validated in all clinical situations. eGFR's persistently <60 mL/min signify possible Chronic Kidney Disease. 11/29/2010 0930     ASSESSMENT AND PLAN Atrial fibrillation  She has not had atrial fibrillation in a while. Continue metoprolol. Therapeutically anticoagulated with warfarin without complications.  Now willing to try pacemaker remote transmissions.  Hyperlipidemia  Will request copy of labs from Dr. Nevada Crane.  CAD s/p CABG 1993  No symptoms of angina or congestive heart failure. She has a known chronic total occlusion of the right coronary artery with an occluded vein graft to that vessel, inferior scar and anterior apical scar with an ejection fraction estimated around 45%.   PAD (peripheral artery disease)  She has an asymptomatic moderate to severe stenosis in the left superficial femoral artery. Plan reevaluation with duplex ultrasound in February 2016.  Patient Instructions  Remote monitoring is used to monitor your pacemaker from home. This monitoring reduces the number of office visits required to check your device to one time per year. It allows [place] to keep an eye on the functioning of your device to ensure it is working properly. You are scheduled for a device check from home on 03-09-2014. You may send your transmission at any  time that day. If you have a wireless device, the transmission will be sent automatically. After your physician reviews your transmission, you will receive a postcard with your next transmission date.  Dr Sallyanne Kuster has made NO CHANGES today in your current medications.  Your physician recommends that you schedule a  follow-up appointment in: 6 months with Dr.Tandrea Kommer    Meds ordered this encounter  Medications  . lisinopril (PRINIVIL,ZESTRIL) 10 MG tablet    Sig: Take 0.5 tablets by mouth daily.    Sari Cogan  Sanda Klein, MD, [facility] [phone] office [phone] pager

## 2013-12-07 ENCOUNTER — Ambulatory Visit (INDEPENDENT_AMBULATORY_CARE_PROVIDER_SITE_OTHER): Payer: No Typology Code available for payment source | Admitting: *Deleted

## 2013-12-07 DIAGNOSIS — Z7901 Long term (current) use of anticoagulants: Secondary | ICD-10-CM

## 2013-12-07 DIAGNOSIS — I4891 Unspecified atrial fibrillation: Secondary | ICD-10-CM

## 2014-01-26 DIAGNOSIS — Z7901 Long term (current) use of anticoagulants: Secondary | ICD-10-CM | POA: Diagnosis not present

## 2014-01-26 DIAGNOSIS — I4891 Unspecified atrial fibrillation: Secondary | ICD-10-CM | POA: Diagnosis not present

## 2014-02-09 DIAGNOSIS — C679 Malignant neoplasm of bladder, unspecified: Secondary | ICD-10-CM | POA: Diagnosis not present

## 2014-02-09 DIAGNOSIS — C677 Malignant neoplasm of urachus: Secondary | ICD-10-CM | POA: Diagnosis not present

## 2014-02-15 DIAGNOSIS — C679 Malignant neoplasm of bladder, unspecified: Secondary | ICD-10-CM | POA: Diagnosis not present

## 2014-02-23 DIAGNOSIS — E119 Type 2 diabetes mellitus without complications: Secondary | ICD-10-CM | POA: Diagnosis not present

## 2014-02-23 DIAGNOSIS — I10 Essential (primary) hypertension: Secondary | ICD-10-CM | POA: Diagnosis not present

## 2014-02-23 DIAGNOSIS — Z7901 Long term (current) use of anticoagulants: Secondary | ICD-10-CM | POA: Diagnosis not present

## 2014-02-25 ENCOUNTER — Emergency Department (HOSPITAL_COMMUNITY)
Admission: EM | Admit: 2014-02-25 | Discharge: 2014-02-26 | Disposition: A | Discharge status: Home / Self Care | Payer: Medicare Other | Attending: Emergency Medicine | Admitting: Emergency Medicine

## 2014-02-25 ENCOUNTER — Encounter (HOSPITAL_COMMUNITY): Payer: Self-pay | Admitting: Emergency Medicine

## 2014-02-25 DIAGNOSIS — Z950 Presence of cardiac pacemaker: Secondary | ICD-10-CM | POA: Insufficient documentation

## 2014-02-25 DIAGNOSIS — Z951 Presence of aortocoronary bypass graft: Secondary | ICD-10-CM | POA: Diagnosis not present

## 2014-02-25 DIAGNOSIS — I2510 Atherosclerotic heart disease of native coronary artery without angina pectoris: Secondary | ICD-10-CM | POA: Diagnosis not present

## 2014-02-25 DIAGNOSIS — Z79899 Other long term (current) drug therapy: Secondary | ICD-10-CM | POA: Insufficient documentation

## 2014-02-25 DIAGNOSIS — E785 Hyperlipidemia, unspecified: Secondary | ICD-10-CM | POA: Insufficient documentation

## 2014-02-25 DIAGNOSIS — Y9389 Activity, other specified: Secondary | ICD-10-CM | POA: Insufficient documentation

## 2014-02-25 DIAGNOSIS — Z794 Long term (current) use of insulin: Secondary | ICD-10-CM | POA: Diagnosis not present

## 2014-02-25 DIAGNOSIS — Z9889 Other specified postprocedural states: Secondary | ICD-10-CM | POA: Insufficient documentation

## 2014-02-25 DIAGNOSIS — Z87891 Personal history of nicotine dependence: Secondary | ICD-10-CM | POA: Insufficient documentation

## 2014-02-25 DIAGNOSIS — Y929 Unspecified place or not applicable: Secondary | ICD-10-CM | POA: Insufficient documentation

## 2014-02-25 DIAGNOSIS — S32009A Unspecified fracture of unspecified lumbar vertebra, initial encounter for closed fracture: Secondary | ICD-10-CM | POA: Insufficient documentation

## 2014-02-25 DIAGNOSIS — W1809XA Striking against other object with subsequent fall, initial encounter: Secondary | ICD-10-CM | POA: Diagnosis not present

## 2014-02-25 DIAGNOSIS — I4891 Unspecified atrial fibrillation: Secondary | ICD-10-CM | POA: Insufficient documentation

## 2014-02-25 DIAGNOSIS — I252 Old myocardial infarction: Secondary | ICD-10-CM | POA: Insufficient documentation

## 2014-02-25 DIAGNOSIS — I10 Essential (primary) hypertension: Secondary | ICD-10-CM | POA: Diagnosis not present

## 2014-02-25 DIAGNOSIS — Z7982 Long term (current) use of aspirin: Secondary | ICD-10-CM | POA: Diagnosis not present

## 2014-02-25 DIAGNOSIS — IMO0002 Reserved for concepts with insufficient information to code with codable children: Secondary | ICD-10-CM | POA: Diagnosis not present

## 2014-02-25 DIAGNOSIS — Z7901 Long term (current) use of anticoagulants: Secondary | ICD-10-CM | POA: Diagnosis not present

## 2014-02-25 DIAGNOSIS — Z8551 Personal history of malignant neoplasm of bladder: Secondary | ICD-10-CM | POA: Diagnosis not present

## 2014-02-25 DIAGNOSIS — S22009A Unspecified fracture of unspecified thoracic vertebra, initial encounter for closed fracture: Secondary | ICD-10-CM | POA: Diagnosis not present

## 2014-02-25 DIAGNOSIS — E119 Type 2 diabetes mellitus without complications: Secondary | ICD-10-CM | POA: Diagnosis not present

## 2014-02-25 DIAGNOSIS — S32000A Wedge compression fracture of unspecified lumbar vertebra, initial encounter for closed fracture: Secondary | ICD-10-CM

## 2014-02-25 MED ORDER — OXYCODONE-ACETAMINOPHEN 5-325 MG PO TABS
1.0000 | ORAL_TABLET | Freq: Once | ORAL | Status: AC
  Administered 2014-02-25: 1 via ORAL
  Filled 2014-02-25: qty 1

## 2014-02-25 NOTE — ED Notes (Signed)
Pt reports sitting on a board and board broke, causing her to fall to a sitting position.  Pt reporting pain in lower back and right buttock.  States fall occurred on Thursday.  Currently being treated with Tylenol.

## 2014-02-25 NOTE — ED Provider Notes (Signed)
CSN: [id number]     Arrival date & time 02/25/14  2158 History  This chart was scribed for Sharyon Cable, MD by Girtha Hake, ED Scribe. The patient was seen in [place]. The patient's care was started at 11:38 PM.   Chief Complaint  Patient presents with  . Fall   HPI HPI Comments: Megan Haley is a 80 y.o. female who presents to the Emergency Department complaining of fall. Patient reports that she was sitting on a "2x4" when she fell and landed on her buttocks two days ago. Patient reports associated lower back pain. She states that she has taken Tylenol with no relief of the pain. She reports that she also hit her head on the wall. She denies neck pain, HA, CP, LOC, numbness or tingling in legs.   PCP is Dr. Nevada Crane.  Past Medical History  Diagnosis Date  . MI (myocardial infarction) 1993    CABG in [place]  . Pacemaker   . CAD (coronary artery disease)   . PAD (peripheral artery disease)   . IDDM (insulin dependent diabetes mellitus)   . Hypertension   . Former smoker   . Bladder cancer     surgery & chemo  . Hyperlipidemia   . PAF (paroxysmal atrial fibrillation)    Past Surgical History  Procedure Laterality Date  . Yag laser application Right 25/42/7062    Procedure: YAG LASER APPLICATION;  Surgeon: Elta Guadeloupe T. Gershon Crane, MD;  Location: [facility];  Service: Ophthalmology;  Laterality: Right;  . Bladder repair  1990  . Abdominal hysterectomy  1900  . Bladder surgery  2007    r/t bladder cancer  . [place]  - LIMA to LAD, VG to PDA, VG and marginal   . Transthoracic echocardiogram  01/2013    EF 40-45%, mild LVH; ventricular spetum with abnormal function & dysynergy; calcified MV annulus, mild MR; PA peak pressure 25mHg  . Nm myocar perf wall motion  12/2012    lexiscan - low riskwith medium-large size, severe in intensity fixed defect consistent with previous infarction & small size, mild intensity fixed defect in distal anterior to  anteroapical region consistent with previous infarction  . Cardiac catheterization  04/03/2006    [facility] diffus[facility], LAD w/50-60% diffuse disease and 80% lesion in prox/mid region; LCfx totally occluded proximally; RCA with 70% prox lesion after which RCA was totally occluded; patent LIMA-LAD; VG to RCA occluded; EF 40% (Dr. AMarisa Cyphers  . Upper extremity arterial doppler  01/2013    non-compressible calcified bilateral brachial pressure, bilat digits WNL  . Carotid doppler  07/2012    mild amount of fibrous plaque in bilateral bulb/prox ICA; LECA with elevated velocities  . Lower extremity arterial doppler  07/2012    moderate to severe arterial insufficiency, diffuse plaque in R SFA & less than 50% diameter reduction; L SFA with diffuse plaque & 70-99% diameter reduction  . Pacemaker insertion  07/30/2007    [employer] Adapta ADDR01 - Dr. RGerrie Nordmann- symptomatic bradycardia  . Carpal tunnel release     Family History  Problem Relation Age of Onset  . Hypertension Mother   . Skin cancer Mother   . Pneumonia Mother   . Diabetes Father   . Heart attack Father 732 . Appendicitis Maternal Grandmother   . Diabetes Paternal Grandmother   . Heart disease Paternal Grandmother   . Asthma Paternal Grandfather   .  Diabetes Sister   . Bladder Cancer Sister   . Hypertension Child    History  Substance Use Topics  . Smoking status: Former Smoker -- 38 years    Types: Cigarettes    Quit date: 11/28/1990  . Smokeless tobacco: Not on file  . Alcohol Use: No   OB History   Grav Para Term Preterm Abortions TAB SAB Ect Mult Living                 Review of Systems  Respiratory: Negative for shortness of breath.   Cardiovascular: Negative for chest pain.  Musculoskeletal: Positive for back pain. Negative for neck pain.  Neurological: Negative for syncope, weakness, numbness and headaches.      Allergies  Lisinopril  Home Medications   Prior to Admission  medications   Medication Sig Start Date End Date Taking? Authorizing Provider  aspirin EC 81 MG tablet Take 81 mg by mouth daily.    Historical Provider, MD  Biotin 1000 MCG tablet Take 1,000 mcg by mouth daily.    Historical Provider, MD  Blood Glucose Monitoring Suppl (ONE TOUCH ULTRA MINI) W/DEVICE KIT  08/01/13   Historical Provider, MD  Cholecalciferol (VITAMIN D-3) 1000 UNITS CAPS Take by mouth.    Historical Provider, MD  Choline Fenofibrate (FENOFIBRIC ACID) 135 MG CPDR Take 135 mg by mouth daily. 06/03/13   Historical Provider, MD  Insulin Lispro, Human, (HUMALOG PEN [place]) Inject into the skin. Take 3-7 units    Historical Provider, MD  LANTUS 100 UNIT/ML injection Inject 40 Units into the skin at bedtime.  05/12/13   Historical Provider, MD  lisinopril (PRINIVIL,ZESTRIL) 10 MG tablet Take 0.5 tablets by mouth daily. 11/24/13   Historical Provider, MD  metoprolol succinate (TOPROL-XL) 25 MG 24 hr tablet Take 25 mg by mouth daily. 05/08/13   Historical Provider, MD  ONE TOUCH ULTRA TEST test strip  08/02/13   Historical Provider, MD  sertraline (ZOLOFT) 50 MG tablet Take 50 mg by mouth daily. 04/12/13   Historical Provider, MD  simvastatin (ZOCOR) 20 MG tablet Take 20 mg by mouth daily. 06/03/13   Historical Provider, MD  warfarin (COUMADIN) 2 MG tablet Take 2 mg by mouth daily. Takes 2 mg everyday except on Wed and Sun when she takes 1 mg. 05/27/13   Historical Provider, MD   Triage Vitals: BP 147/34  Pulse 75  Temp(Src) 98.2 F (36.8 C)  Ht _0  (1.575 m)  Wt 158 lb (71.668 kg)  BMI 28.89 kg/m2  SpO2 92% Physical Exam CONSTITUTIONAL: Well developed/well nourished HEAD: Normocephalic/atraumatic EYES: EOMI/PERRL ENMT: Mucous membranes moist NECK: supple no meningeal signs SPINE: lumbar tenderness noted; no bruisng or stepoffs  CV: S1/S2 noted, no murmurs/rubs/gallops noted LUNGS: Lungs are clear to auscultation bilaterally, no apparent distress ABDOMEN: soft, nontender, no rebound or  guarding GU:no cva tenderness NEURO: Pt is awake/alert, moves all extremitiesx4, no focal weakness in lower extemities, atient ambulatory without difficulty  EXTREMITIES: pulses normal, full ROM, bruising to right buttock without significant tenderness No tenderness with ROM of either hip SKIN: warm, color normal PSYCH: no abnormalities of mood noted  ED Course  Procedures  DIAGNOSTIC STUDIES: Oxygen Saturation is 92% on room air, adequate by my interpretation.    COORDINATION OF CARE: 11:42 PM-Discussed treatment plan which includes an L-spine x-ray and Percocet with pt at bedside and pt agreed to plan.     Discussed imaging with radiology (pt with pacemaker, it was mentioned for any future MRI) Pt  ambulatory,no distress, she has no focal neuro deficits noted.  Will start percocet for pain control No complicating features to compression fx I don't feel need for INR check today (she reports recent check at PCP within limits) Will start on pain meds and PCP followup   Imaging Review Dg Lumbar Spine Complete  02/26/2014   CLINICAL DATA:  Fall from a seated position 3 days ago.  EXAM: LUMBAR SPINE - COMPLETE 4+ VIEW  COMPARISON:  CT of the abdomen pelvis August 17, 2006  FINDINGS: Mild acute appearing L2 compression fracture with less than 20% superior endplate height loss, no retropulsed bony fragments. Patient is osteopenic. Large L3 inferior endplate Schmorl's node. Moderate L3-4, L4-5 disc degeneration.  No pars interarticularis defects. Bone mineral density is decreased without destructive bony lesions. Moderate to severe aortoiliac vascular calcifications. Cardiac pacer wires in place. Phleboliths in the right pelvis. Linear lucency through right sacrum may be projectional.  IMPRESSION: Mild acute appearing L2 compression fracture, no malalignment. Please note, presence of cardiac pacemaker may be a contraindication at this time.  Linear lucency through right sacral could be artifact or,  reflect a nondisplaced fracture, recommend correlation with point tenderness, consider dedicated imaging if clinically indicated.   Electronically Signed   By: Elon Alas   On: 02/26/2014 00:49      MDM   Final diagnoses:  Lumbar compression fracture, closed, initial encounter    Nursing notes including past medical history and social history reviewed and considered in documentation xrays reviewed and considered   I personally performed the services described in this documentation, which was scribed in my presence. The recorded information has been reviewed and is accurate.      Sharyon Cable, MD 02/26/14 [phone]

## 2014-02-26 ENCOUNTER — Emergency Department (HOSPITAL_COMMUNITY): Payer: Medicare Other

## 2014-02-26 DIAGNOSIS — S22009A Unspecified fracture of unspecified thoracic vertebra, initial encounter for closed fracture: Secondary | ICD-10-CM | POA: Diagnosis not present

## 2014-02-26 DIAGNOSIS — IMO0002 Reserved for concepts with insufficient information to code with codable children: Secondary | ICD-10-CM | POA: Diagnosis not present

## 2014-02-26 MED ORDER — OXYCODONE-ACETAMINOPHEN 5-325 MG PO TABS: 1.0000 | ORAL_TABLET | ORAL | Status: DC | PRN

## 2014-02-26 NOTE — Discharge Instructions (Signed)
Back, Compression Fracture °A compression fracture happens when a force is put upon the length of your spine. Slipping and falling on your bottom are examples of such a force. When this happens, sometimes the force is great enough to compress the building blocks (vertebral bodies) of your spine. Although this causes a lot of pain, this can usually be treated at home, unless your caregiver feels hospitalization is needed for pain control. °Your backbone (spinal column) is made up of 24 main vertebral bodies in addition to the sacrum and coccyx (see illustration). These are held together by tough fibrous tissues (ligaments) and by support of your muscles. Nerve roots pass through the openings between the vertebrae. A sudden wrenching move, injury, or a fall may cause a compression fracture of one of the vertebral bodies. This may result in back pain or spread of pain into the belly (abdomen), the buttocks, and down the leg into the foot. Pain may also be created by muscle spasm alone. °Large studies have been undertaken to determine the best possible course of action to help your back following injury and also to prevent future problems. The recommendations are as follows. °FOLLOWING A COMPRESSION FRACTURE: °Do the following only if advised by your caregiver.  °· If a back brace has been suggested or provided, wear it as directed. °· Do not stop wearing the back brace unless instructed by your caregiver. °· When allowed to return to regular activities, avoid a sedentary lifestyle. Actively exercise. Sporadic weekend binges of tennis, racquetball, or waterskiing may actually aggravate or create problems, especially if you are not in condition for that activity. °· Avoid sports requiring sudden body movements until you are in condition for them. Swimming and walking are safer activities. °· Maintain good posture. °· Avoid obesity. °· If not already done, you should have a DEXA scan. Based on the results, be treated for  osteoporosis. °FOLLOWING ACUTE (SUDDEN) INJURY: °· Only take over-the-counter or prescription medicines for pain, discomfort, or fever as directed by your caregiver. °· Use bed rest for only the most extreme acute episode. Prolonged bed rest may aggravate your condition. Ice used for acute conditions is effective. Use a large plastic bag filled with ice. Wrap it in a towel. This also provides excellent pain relief. This may be continuous. Or use it for 30 minutes every 2 hours during acute phase, then as needed. Heat for 30 minutes prior to activities is helpful. °· As soon as the acute phase (the time when your back is too painful for you to do normal activities) is over, it is important to resume normal activities and work hardening programs. Back injuries can cause potentially marked changes in lifestyle. So it is important to attack these problems aggressively. °· See your caregiver for continued problems. He or she can help or refer you for appropriate exercises, physical therapy, and work hardening if needed. °· If you are given narcotic medications for your condition, for the next 24 hours do not: °¨ Drive. °¨ Operate machinery or power tools. °¨ Sign legal documents. °· Do not drink alcohol, or take sleeping pills or other medications that may interfere with treatment. °If your caregiver has given you a follow-up appointment, it is very important to keep that appointment. Not keeping the appointment could result in a chronic or permanent injury, pain, and disability. If there is any problem keeping the appointment, you must call back to this facility for assistance.  °SEEK IMMEDIATE MEDICAL CARE IF: °· You develop numbness,   tingling, weakness, or problems with the use of your arms or legs. °· You develop severe back pain not relieved with medications. °· You have changes in bowel or bladder control. °· You have increasing pain in any areas of the body. °Document Released: 07/14/2005 Document Revised:  11/28/2013 Document Reviewed: 02/16/2008 °Exit[employer]® Patient Information ©2015 [employer]. This information is not intended to replace advice given to you by your health care provider. Make sure you discuss any questions you have with your health care provider. ° °

## 2014-02-28 DIAGNOSIS — I10 Essential (primary) hypertension: Secondary | ICD-10-CM | POA: Diagnosis not present

## 2014-02-28 DIAGNOSIS — E782 Mixed hyperlipidemia: Secondary | ICD-10-CM | POA: Diagnosis not present

## 2014-02-28 DIAGNOSIS — Z7901 Long term (current) use of anticoagulants: Secondary | ICD-10-CM | POA: Diagnosis not present

## 2014-02-28 DIAGNOSIS — IMO0001 Reserved for inherently not codable concepts without codable children: Secondary | ICD-10-CM | POA: Diagnosis not present

## 2014-03-01 ENCOUNTER — Telehealth: Payer: Self-pay | Admitting: *Deleted

## 2014-03-01 NOTE — Telephone Encounter (Signed)
Attempted to assist setting up a home transmission. Pt has [employer]lick svc, I mailed a DSL filter. Pt's daughter will send a transmission once filter received.

## 2014-03-05 ENCOUNTER — Emergency Department (HOSPITAL_COMMUNITY): Payer: Medicare Other

## 2014-03-05 ENCOUNTER — Emergency Department (HOSPITAL_COMMUNITY)
Admission: EM | Admit: 2014-03-05 | Discharge: 2014-03-05 | Disposition: A | Discharge status: Home / Self Care | Payer: Medicare Other | Attending: Emergency Medicine | Admitting: Emergency Medicine

## 2014-03-05 ENCOUNTER — Encounter (HOSPITAL_COMMUNITY): Payer: Self-pay | Admitting: Emergency Medicine

## 2014-03-05 DIAGNOSIS — Z79899 Other long term (current) drug therapy: Secondary | ICD-10-CM | POA: Diagnosis not present

## 2014-03-05 DIAGNOSIS — R059 Cough, unspecified: Secondary | ICD-10-CM | POA: Insufficient documentation

## 2014-03-05 DIAGNOSIS — E785 Hyperlipidemia, unspecified: Secondary | ICD-10-CM | POA: Diagnosis not present

## 2014-03-05 DIAGNOSIS — IMO0002 Reserved for concepts with insufficient information to code with codable children: Secondary | ICD-10-CM | POA: Diagnosis not present

## 2014-03-05 DIAGNOSIS — R52 Pain, unspecified: Secondary | ICD-10-CM | POA: Diagnosis not present

## 2014-03-05 DIAGNOSIS — E119 Type 2 diabetes mellitus without complications: Secondary | ICD-10-CM | POA: Diagnosis not present

## 2014-03-05 DIAGNOSIS — M549 Dorsalgia, unspecified: Secondary | ICD-10-CM | POA: Insufficient documentation

## 2014-03-05 DIAGNOSIS — Z7982 Long term (current) use of aspirin: Secondary | ICD-10-CM | POA: Insufficient documentation

## 2014-03-05 DIAGNOSIS — Z8781 Personal history of (healed) traumatic fracture: Secondary | ICD-10-CM | POA: Insufficient documentation

## 2014-03-05 DIAGNOSIS — Z9889 Other specified postprocedural states: Secondary | ICD-10-CM | POA: Diagnosis not present

## 2014-03-05 DIAGNOSIS — I252 Old myocardial infarction: Secondary | ICD-10-CM | POA: Insufficient documentation

## 2014-03-05 DIAGNOSIS — S32009A Unspecified fracture of unspecified lumbar vertebra, initial encounter for closed fracture: Secondary | ICD-10-CM | POA: Diagnosis not present

## 2014-03-05 DIAGNOSIS — Z950 Presence of cardiac pacemaker: Secondary | ICD-10-CM | POA: Diagnosis not present

## 2014-03-05 DIAGNOSIS — R109 Unspecified abdominal pain: Secondary | ICD-10-CM | POA: Insufficient documentation

## 2014-03-05 DIAGNOSIS — Z794 Long term (current) use of insulin: Secondary | ICD-10-CM | POA: Insufficient documentation

## 2014-03-05 DIAGNOSIS — I4891 Unspecified atrial fibrillation: Secondary | ICD-10-CM | POA: Insufficient documentation

## 2014-03-05 DIAGNOSIS — R0602 Shortness of breath: Secondary | ICD-10-CM | POA: Insufficient documentation

## 2014-03-05 DIAGNOSIS — M545 Low back pain, unspecified: Secondary | ICD-10-CM | POA: Diagnosis not present

## 2014-03-05 DIAGNOSIS — K5900 Constipation, unspecified: Secondary | ICD-10-CM | POA: Insufficient documentation

## 2014-03-05 DIAGNOSIS — Z951 Presence of aortocoronary bypass graft: Secondary | ICD-10-CM | POA: Insufficient documentation

## 2014-03-05 DIAGNOSIS — I10 Essential (primary) hypertension: Secondary | ICD-10-CM | POA: Diagnosis not present

## 2014-03-05 DIAGNOSIS — I2510 Atherosclerotic heart disease of native coronary artery without angina pectoris: Secondary | ICD-10-CM | POA: Diagnosis not present

## 2014-03-05 DIAGNOSIS — M25559 Pain in unspecified hip: Secondary | ICD-10-CM | POA: Diagnosis not present

## 2014-03-05 DIAGNOSIS — R05 Cough: Secondary | ICD-10-CM | POA: Insufficient documentation

## 2014-03-05 DIAGNOSIS — S32000S Wedge compression fracture of unspecified lumbar vertebra, sequela: Secondary | ICD-10-CM

## 2014-03-05 DIAGNOSIS — Z7901 Long term (current) use of anticoagulants: Secondary | ICD-10-CM | POA: Insufficient documentation

## 2014-03-05 DIAGNOSIS — Z87891 Personal history of nicotine dependence: Secondary | ICD-10-CM | POA: Diagnosis not present

## 2014-03-05 MED ORDER — HYDROCODONE-ACETAMINOPHEN 5-325 MG PO TABS
1.0000 | ORAL_TABLET | Freq: Once | ORAL | Status: AC
  Administered 2014-03-05: 1 via ORAL
  Filled 2014-03-05: qty 1

## 2014-03-05 MED ORDER — CYCLOBENZAPRINE HCL 10 MG PO TABS: 10.0000 mg | ORAL_TABLET | Freq: Two times a day (BID) | ORAL | Status: DC | PRN

## 2014-03-05 NOTE — ED Notes (Signed)
PT fell on 02/25/14 and was dx with compression fracture to back and followed up with her primary doctor and he prescribed hydrocodone. PT states her pain is increasing and medication is no relief.

## 2014-03-05 NOTE — Discharge Instructions (Signed)
Extensive workup here does confirm the lumbar compression fracture no evidence of any pelvic fractures or hip fractures. Normal treatment course would be pain control for the next at least 7 days followup with your doctor. Return for any new or worse symptoms. Recommend trial of Flexeril also can continue the pain medication she had at home.

## 2014-03-05 NOTE — ED Provider Notes (Signed)
CSN: [id number]     Arrival date & time 03/05/14  1620 History  This chart was scribed for Fredia Sorrow, MD by Starleen Arms, ED Scribe. This patient was seen in room [facility]19[facility] and the patient's care was started at 5:36 PM.    Chief Complaint  Patient presents with  . Back Pain    Patient is a 80 y.o. female presenting with back pain. The history is provided by the patient. No language interpreter was used.  Back Pain Location:  Sacro-iliac joint Radiates to:  Does not radiate Pain severity:  Moderate Duration:  1 week Timing:  Constant Progression:  Unchanged Chronicity:  New Relieved by:  Nothing Worsened by:  Nothing tried Ineffective treatments:  Narcotics Associated symptoms: abdominal pain   Associated symptoms: no chest pain, no dysuria, no fever, no headaches and no numbness     HPI Comments: Megan Haley is a 80 y.o. female who presents to the Emergency Department complaining of constant, aching lower back pain.    Per chart review the patient was seen in the ED on 8/1 two days after a mechanical fall during which she landed on her buttocks.  She reported associated head trauma and back pain during this prior visit and was diagnosed with a closed lumbar compression fracture.  Patient was advised to rest and prescribed oxycodone and hydrocodone but reports that these medications only made her sleepy and constipated but afforded no pain relief.  As a result, she has not been compliant for the last two days with these medications.  She states that when she walks the pain is located at her hips but when lying down the pain is in the area of her tailbone. She rates the pain currently at an 8/10.  She states that she has been prescribed a muscle relaxer for a historically unrelated pain; while she is unable to recall the name of the medication with certainty, she states it may have been Flexeril.  She also reports associated bilateral lower quadrant abdominal pain that began in  the days following the fall.  Patient states she is prescribed Cumadin and is compliant.  Patient denies nausea, vomiting, numbness, dysuria.  Patient has associated cough, SOB normal to baseline (pt is prescribed at-home oxygen and is irregularly compliant), abdominal pain, constipation, nosebleeds.    Past Medical History  Diagnosis Date  . MI (myocardial infarction) 1993    CABG in [place]  . Pacemaker   . CAD (coronary artery disease)   . PAD (peripheral artery disease)   . IDDM (insulin dependent diabetes mellitus)   . Hypertension   . Former smoker   . Bladder cancer     surgery & chemo  . Hyperlipidemia   . PAF (paroxysmal atrial fibrillation)    Past Surgical History  Procedure Laterality Date  . Yag laser application Right 75/04/2584    Procedure: YAG LASER APPLICATION;  Surgeon: Elta Guadeloupe T. Gershon Crane, MD;  Location: [facility];  Service: Ophthalmology;  Laterality: Right;  . Bladder repair  1990  . Abdominal hysterectomy  1900  . Bladder surgery  2007    r/t bladder cancer  . [place]  - LIMA to LAD, VG to PDA, VG and marginal   . Transthoracic echocardiogram  01/2013    EF 40-45%, mild LVH; ventricular spetum with abnormal function & dysynergy; calcified MV annulus, mild MR; PA peak pressure 4mmHg  . Nm myocar perf wall motion  12/2012  lexiscan - low riskwith medium-large size, severe in intensity fixed defect consistent with previous infarction & small size, mild intensity fixed defect in distal anterior to anteroapical region consistent with previous infarction  . Cardiac catheterization  04/03/2006    [facility] - L main diffusely disease, LAD w/50-60% diffuse disease and 80% lesion in prox/mid region; LCfx totally occluded proximally; RCA with 70% prox lesion after which RCA was totally occluded; patent LIMA-LAD; VG to RCA occluded; EF 40% (Dr. Marisa Cyphers)  . Upper extremity arterial doppler  01/2013    non-compressible calcified  bilateral brachial pressure, bilat digits WNL  . Carotid doppler  07/2012    mild amount of fibrous plaque in bilateral bulb/prox ICA; LECA with elevated velocities  . Lower extremity arterial doppler  07/2012    moderate to severe arterial insufficiency, diffuse plaque in R SFA & less than 50% diameter reduction; L SFA with diffuse plaque & 70-99% diameter reduction  . Pacemaker insertion  07/30/2007    [employer] Adapta ADDR01 - Dr. Gerrie Nordmann - symptomatic bradycardia  . Carpal tunnel release     Family History  Problem Relation Age of Onset  . Hypertension Mother   . Skin cancer Mother   . Pneumonia Mother   . Diabetes Father   . Heart attack Father 65  . Appendicitis Maternal Grandmother   . Diabetes Paternal Grandmother   . Heart disease Paternal Grandmother   . Asthma Paternal Grandfather   . Diabetes Sister   . Bladder Cancer Sister   . Hypertension Child    History  Substance Use Topics  . Smoking status: Former Smoker -- 38 years    Types: Cigarettes    Quit date: 11/28/1990  . Smokeless tobacco: Not on file  . Alcohol Use: No   OB History   Grav Para Term Preterm Abortions TAB SAB Ect Mult Living                 Review of Systems  Constitutional: Negative for fever and chills.  HENT: Positive for nosebleeds. Negative for rhinorrhea and sore throat.   Eyes: Negative for visual disturbance.  Respiratory: Positive for cough and shortness of breath.   Cardiovascular: Negative for chest pain and leg swelling.  Gastrointestinal: Positive for abdominal pain and constipation. Negative for nausea, vomiting and diarrhea.  Genitourinary: Negative for dysuria.  Musculoskeletal: Positive for back pain. Negative for neck pain.  Skin: Negative for rash.  Neurological: Negative for numbness and headaches.  Hematological: Bruises/bleeds easily.  Psychiatric/Behavioral: Negative for confusion.      Allergies  Lisinopril  Home Medications   Prior to Admission medications    Medication Sig Start Date End Date Taking? Authorizing Provider  aspirin EC 81 MG tablet Take 81 mg by mouth at bedtime.    Yes Historical Provider, MD  Biotin 1000 MCG tablet Take 1,000 mcg by mouth every morning.    Yes Historical Provider, MD  Cholecalciferol (VITAMIN D-3) 1000 UNITS CAPS Take 2,000 Units by mouth every morning.    Yes Historical Provider, MD  Choline Fenofibrate (TRILIPIX) 135 MG capsule Take 135 mg by mouth every morning.   Yes Historical Provider, MD  HYDROcodone-acetaminophen (NORCO/VICODIN) 5-325 MG per tablet Take 0.5-1 tablets by mouth every 6 (six) hours as needed for moderate pain or severe pain.  03/02/14  Yes Historical Provider, MD  Insulin Lispro, Human, (HUMALOG PEN [place]) Inject 3-7 Units into the skin 3 (three) times daily before meals.    Yes Historical Provider,  MD  LANTUS 100 UNIT/ML injection Inject 40 Units into the skin at bedtime.  05/12/13  Yes Historical Provider, MD  lisinopril (PRINIVIL,ZESTRIL) 10 MG tablet Take 0.5 tablets by mouth every morning.  11/24/13  Yes Historical Provider, MD  metoprolol succinate (TOPROL-XL) 25 MG 24 hr tablet Take 25 mg by mouth every morning.  05/08/13  Yes Historical Provider, MD  sertraline (ZOLOFT) 50 MG tablet Take 50 mg by mouth at bedtime.  04/12/13  Yes Historical Provider, MD  simvastatin (ZOCOR) 20 MG tablet Take 20 mg by mouth at bedtime.  06/03/13  Yes Historical Provider, MD  warfarin (COUMADIN) 2 MG tablet Take 1-2 mg by mouth See admin instructions. Takes 2 mg everyday except on Wed and Sun when she takes 1 mg. 05/27/13  Yes Historical Provider, MD  cyclobenzaprine (FLEXERIL) 10 MG tablet Take 1 tablet (10 mg total) by mouth 2 (two) times daily as needed for muscle spasms. 03/05/14   Fredia Sorrow, MD   BP 160/91  Pulse 73  Temp(Src) 98 F (36.7 C) (Oral)  Resp 20  Ht 5\' 5"  (1.651 m)  Wt 160 lb (72.576 kg)  BMI 26.63 kg/m2  SpO2 96% Physical Exam  Nursing note and vitals reviewed. Constitutional: She is  oriented to person, place, and time. She appears well-developed and well-nourished. No distress.  HENT:  Head: Normocephalic and atraumatic.  Eyes: Conjunctivae and EOM are normal.  Neck: Neck supple. No tracheal deviation present.  Cardiovascular: Normal rate and regular rhythm.   Pulmonary/Chest: Effort normal and breath sounds normal. No respiratory distress. She has no wheezes. She has no rales.  Abdominal: Bowel sounds are normal. There is no tenderness.  Flat  Musculoskeletal: Normal range of motion. She exhibits no edema.  Neurological: She is alert and oriented to person, place, and time. No cranial nerve deficit. She exhibits normal muscle tone. Coordination normal.  Skin: Skin is warm and dry.  15x5 cm hematoma on right buttocks only.    Psychiatric: She has a normal mood and affect. Her behavior is normal.    ED Course  Procedures (including critical care time)  DIAGNOSTIC STUDIES: Oxygen Saturation is 96% on RA, normal by my interpretation.    COORDINATION OF CARE:  5:40 PM Discussed plan to obtain lumbar spine CT, and DG hip bilateral with pelvis.  Patient acknowledges and agrees with plan.    Labs Review Labs Reviewed - No data to display  Imaging Review No results found.   EKG Interpretation None     Results for orders placed in visit on 12/07/13  POCT INR      Result Value Ref Range   INR 2.6     Dg Lumbar Spine Complete  02/26/2014   CLINICAL DATA:  Fall from a seated position 3 days ago.  EXAM: LUMBAR SPINE - COMPLETE 4+ VIEW  COMPARISON:  CT of the abdomen pelvis August 17, 2006  FINDINGS: Mild acute appearing L2 compression fracture with less than 20% superior endplate height loss, no retropulsed bony fragments. Patient is osteopenic. Large L3 inferior endplate Schmorl's node. Moderate L3-4, L4-5 disc degeneration.  No pars interarticularis defects. Bone mineral density is decreased without destructive bony lesions. Moderate to severe aortoiliac vascular  calcifications. Cardiac pacer wires in place. Phleboliths in the right pelvis. Linear lucency through right sacrum may be projectional.  IMPRESSION: Mild acute appearing L2 compression fracture, no malalignment. Please note, presence of cardiac pacemaker may be a contraindication at this time.  Linear lucency through right sacral  could be artifact or, reflect a nondisplaced fracture, recommend correlation with point tenderness, consider dedicated imaging if clinically indicated.   Electronically Signed   By: Elon Alas   On: 02/26/2014 00:49   Dg Hip Bilateral W/pelvis  03/05/2014   CLINICAL DATA:  [place], BILATERAL hip pain.  EXAM: BILATERAL HIP WITH PELVIS - 4+ VIEW  COMPARISON:  None.  FINDINGS: BILATERAL hip DJD. No fracture of the hip or pelvis. No hip dislocation. Advanced vascular calcification. Lumbar spine DJD. Calcified fibroids. Moderate stool burden.  IMPRESSION: No hip fracture or dislocation.   Electronically Signed   By: Rolla Flatten M.D.   On: 03/05/2014 20:16   Ct Lumbar Spine Wo Contrast  03/05/2014   CLINICAL DATA:  Back pain.  Fell.  EXAM: CT LUMBAR SPINE WITHOUT CONTRAST  TECHNIQUE: Multidetector CT imaging of the lumbar spine was performed without intravenous contrast administration. Multiplanar CT image reconstructions were also generated.  COMPARISON:  Lumbar spine series 8 08/2013.  FINDINGS: Normal alignment of the lumbar vertebral bodies. Advanced osteoporosis. There is a superior endplate compression fracture of L2 with minimal retropulsion of the posterior superior aspect of the vertebral body but no significant canal encroachment. There is a large Schmorl's node involving the L3 vertebral body inferiorly. The other vertebral bodies are maintained. The facets are normally aligned. No pars defects. No facet or laminar fractures.  The spinal canal is fairly generous and there is no significant spinal or foraminal stenosis. No obvious disc herniations or foraminal stenosis. There  is a mild bulging annulus at L4-5 and mild to moderate facet disease contributing to mild spinal and lateral recess stenosis.  Advanced atherosclerotic calcifications involving the aorta and iliac arteries.  IMPRESSION: Acute appearing L2 compression fracture with minimal retropulsion but no canal compromise. The pedicles and posterior elements are intact.  Mild spinal and lateral recess stenosis at L4-5.  Osteoporosis.   Electronically Signed   By: Kalman Jewels M.D.   On: 03/05/2014 18:42      MDM   Final diagnoses:  Midline low back pain without sciatica  Lumbar compression fracture, sequela   Patient does return with persistent complaint of back pain. CT scan of the lumbar area was done for further evaluation of the known compression fracture. That area is in L2. Bilateral hip and pelvis x-rays were done to make sure there was no missed pelvic fracture or hip fracture. Since most of patient's pain was in that area. Workup negative patient will be continued on with her pain medication and followup as scheduled.   I personally performed the services described in this documentation, which was scribed in my presence. The recorded information has been reviewed and is accurate.     Fredia Sorrow, MD 03/09/14 [phone]

## 2014-03-09 ENCOUNTER — Ambulatory Visit (INDEPENDENT_AMBULATORY_CARE_PROVIDER_SITE_OTHER): Payer: Medicare Other | Admitting: *Deleted

## 2014-03-09 ENCOUNTER — Other Ambulatory Visit: Payer: Self-pay | Admitting: Cardiovascular Disease

## 2014-03-09 DIAGNOSIS — I4891 Unspecified atrial fibrillation: Secondary | ICD-10-CM | POA: Diagnosis not present

## 2014-03-09 DIAGNOSIS — I480 Paroxysmal atrial fibrillation: Secondary | ICD-10-CM

## 2014-03-09 NOTE — Progress Notes (Signed)
Remote pacemaker transmission.   

## 2014-03-10 LAB — MDC_IDC_ENUM_SESS_TYPE_INCLINIC
Brady Statistic AP VP Percent: 0.3 %
Brady Statistic AP VS Percent: 99.6 %
Brady Statistic AS VP Percent: 0.1 %
Brady Statistic AS VS Percent: 0.1 %
Lead Channel Impedance Value: 499 Ohm
Lead Channel Pacing Threshold Amplitude: 0.625 V
Lead Channel Pacing Threshold Amplitude: 0.625 V
Lead Channel Pacing Threshold Pulse Width: 0.4 ms
Lead Channel Pacing Threshold Pulse Width: 0.4 ms
Lead Channel Setting Pacing Amplitude: 2 V
Lead Channel Setting Pacing Amplitude: 2.5 V
Lead Channel Setting Pacing Pulse Width: 0.4 ms
MDC IDC MSMT BATTERY REMAINING LONGEVITY: 24 mo
MDC IDC MSMT LEADCHNL RV IMPEDANCE VALUE: 538 Ohm
MDC IDC MSMT LEADCHNL RV SENSING INTR AMPL: 16 mV
MDC IDC SET LEADCHNL RV SENSING SENSITIVITY: 5.6 mV

## 2014-03-21 ENCOUNTER — Encounter: Payer: Self-pay | Admitting: Cardiology

## 2014-03-22 DIAGNOSIS — I4891 Unspecified atrial fibrillation: Secondary | ICD-10-CM | POA: Diagnosis not present

## 2014-03-22 DIAGNOSIS — D649 Anemia, unspecified: Secondary | ICD-10-CM | POA: Diagnosis not present

## 2014-03-22 DIAGNOSIS — Z7901 Long term (current) use of anticoagulants: Secondary | ICD-10-CM | POA: Diagnosis not present

## 2014-03-27 ENCOUNTER — Encounter: Payer: Self-pay | Admitting: Cardiovascular Disease

## 2014-04-05 DIAGNOSIS — Z23 Encounter for immunization: Secondary | ICD-10-CM | POA: Diagnosis not present

## 2014-04-05 DIAGNOSIS — I259 Chronic ischemic heart disease, unspecified: Secondary | ICD-10-CM | POA: Diagnosis not present

## 2014-04-05 DIAGNOSIS — Z7901 Long term (current) use of anticoagulants: Secondary | ICD-10-CM | POA: Diagnosis not present

## 2014-04-05 DIAGNOSIS — E119 Type 2 diabetes mellitus without complications: Secondary | ICD-10-CM | POA: Diagnosis not present

## 2014-04-05 DIAGNOSIS — I4891 Unspecified atrial fibrillation: Secondary | ICD-10-CM | POA: Diagnosis not present

## 2014-04-19 DIAGNOSIS — E119 Type 2 diabetes mellitus without complications: Secondary | ICD-10-CM | POA: Diagnosis not present

## 2014-04-19 DIAGNOSIS — Z7901 Long term (current) use of anticoagulants: Secondary | ICD-10-CM | POA: Diagnosis not present

## 2014-04-19 DIAGNOSIS — I4891 Unspecified atrial fibrillation: Secondary | ICD-10-CM | POA: Diagnosis not present

## 2014-05-17 DIAGNOSIS — I482 Chronic atrial fibrillation: Secondary | ICD-10-CM | POA: Diagnosis not present

## 2014-05-24 DIAGNOSIS — Z7901 Long term (current) use of anticoagulants: Secondary | ICD-10-CM | POA: Diagnosis not present

## 2014-05-24 DIAGNOSIS — I482 Chronic atrial fibrillation: Secondary | ICD-10-CM | POA: Diagnosis not present

## 2014-06-14 DIAGNOSIS — I482 Chronic atrial fibrillation: Secondary | ICD-10-CM | POA: Diagnosis not present

## 2014-06-21 ENCOUNTER — Encounter: Payer: Self-pay | Admitting: *Deleted

## 2014-07-03 ENCOUNTER — Encounter: Payer: No Typology Code available for payment source | Admitting: Cardiovascular Disease

## 2014-07-05 ENCOUNTER — Encounter: Payer: Self-pay | Admitting: Cardiovascular Disease

## 2014-07-05 ENCOUNTER — Ambulatory Visit (INDEPENDENT_AMBULATORY_CARE_PROVIDER_SITE_OTHER): Payer: Medicare Other | Admitting: Cardiovascular Disease

## 2014-07-05 VITALS — BP 122/64 | HR 74 | Resp 20 | Ht 61.5 in | Wt 153.6 lb

## 2014-07-05 DIAGNOSIS — I2510 Atherosclerotic heart disease of native coronary artery without angina pectoris: Secondary | ICD-10-CM | POA: Diagnosis not present

## 2014-07-05 DIAGNOSIS — I4891 Unspecified atrial fibrillation: Secondary | ICD-10-CM | POA: Diagnosis not present

## 2014-07-05 NOTE — Patient Instructions (Signed)
Remote monitoring is used to monitor your Pacemaker from home. This monitoring reduces the number of office visits required to check your device to one time per year. It allows [place] to monitor the functioning of your device to ensure it is working properly. You are scheduled for a device check from home on October 04, 2013. You may send your transmission at any time that day. If you have a wireless device, the transmission will be sent automatically. After your physician reviews your transmission, you will receive a postcard with your next transmission date.  Dr. Sallyanne Kuster recommends that you schedule a follow-up appointment in: One Year.

## 2014-07-07 ENCOUNTER — Encounter: Payer: Self-pay | Admitting: Cardiovascular Disease

## 2014-07-07 LAB — MDC_IDC_ENUM_SESS_TYPE_INCLINIC
Battery Impedance: 2076 Ohm
Battery Remaining Longevity: 25 mo
Battery Voltage: 2.72 V
Brady Statistic AP VP Percent: 0.2 %
Brady Statistic AP VS Percent: 98.4 %
Lead Channel Impedance Value: 474 Ohm
Lead Channel Pacing Threshold Amplitude: 0.5 V
Lead Channel Pacing Threshold Amplitude: 0.5 V
Lead Channel Pacing Threshold Pulse Width: 0.4 ms
Lead Channel Setting Pacing Amplitude: 2 V
Lead Channel Setting Pacing Pulse Width: 0.4 ms
MDC IDC MSMT LEADCHNL RA SENSING INTR AMPL: 2 mV
MDC IDC MSMT LEADCHNL RV IMPEDANCE VALUE: 526 Ohm
MDC IDC MSMT LEADCHNL RV PACING THRESHOLD PULSEWIDTH: 0.4 ms
MDC IDC MSMT LEADCHNL RV SENSING INTR AMPL: 15.68 mV
MDC IDC SET LEADCHNL RA PACING AMPLITUDE: 1.5 V
MDC IDC SET LEADCHNL RV SENSING SENSITIVITY: 5.6 mV
MDC IDC STAT BRADY AS VP PERCENT: 0.1 % — AB
MDC IDC STAT BRADY AS VS PERCENT: 1.4 %

## 2014-07-07 NOTE — Progress Notes (Signed)
Patient ID: Megan Haley, female   DOB: 1931/08/18, 80 y.o.   MRN: [id number]     Reason for office visit Pacemaker check, atrial fibrillation, coronary artery disease   Megan Haley denies angina or dyspnea and is unaware of any rhythm irregularities. Pacemaker interrogation shows brief atrial tachycardia spells (longest 7 seconds), no recent atrial fibrillation. There is nearly 99% atrial pacing and only 0.2 ventricular pacing. Roughly 2 years battery longevity expected.  Shewas sitting on a plank that broke, fell on her back and had a coccygeal fracture, thankfully no serious head injury.   Labs performed by Dr. Nevada Crane "were OK".  She is taking an ACEi for renal protection in IDDM and metoprolol for arrhythmia - she does not truly have HTN.  She had a myocardial infarction in 1993 and underwent multivessel bypass surgery in [place]. She had three-vessel bypass surgery (repeat cardiac catheterization in 2007 showed 80% proximal LAD, totally occluded circumflex, totally occluded RCA, patent LIMA to LAD, patent SVG to OM, total occlusion of the SVG to RCA, LVEF 40% with inferior akinesia sent anteroapical hypokinesis). A nuclear stress test performed in June 2014 showed inferior and anteroapical scar with minimal peri-infarct ischemia. EF by scintigraphy was 51%   She received a dual-chamber permanent pacemaker in 2009 for sinus node dysfunction with symptomatic bradycardia. She has a chronic LBBB. She is not pacemaker dependent, but has pacing in the atrium virtually 100% of the time. She does not require ventricular pacing.   She also has peripheral arterial disease of the lower extremities with a high-grade mid to distal left SFA stenosis estimated to be 70-99% in severity by her last duplex ultrasound in January of this year. She has mild nonobstructive carotid atherosclerosis (she has a high-grade stenosis in the left external carotid).    Allergies  Allergen Reactions  .  Lisinopril Cough    Dizziness    Current Outpatient Prescriptions  Medication Sig Dispense Refill  . aspirin EC 81 MG tablet Take 81 mg by mouth at bedtime.     . Biotin 1000 MCG tablet Take 1,000 mcg by mouth every morning.     . Cholecalciferol (VITAMIN D-3) 1000 UNITS CAPS Take 2,000 Units by mouth every morning.     . Choline Fenofibrate (TRILIPIX) 135 MG capsule Take 135 mg by mouth every morning.    . ferrous sulfate 325 (65 FE) MG tablet Take 325 mg by mouth daily with breakfast.    . insulin lispro (HUMALOG) 100 UNIT/ML KiwkPen Inject 3-7 Units into the skin 3 (three) times daily before meals.    Marland Kitchen LANTUS 100 UNIT/ML injection Inject 40 Units into the skin at bedtime.     Marland Kitchen lisinopril (PRINIVIL,ZESTRIL) 10 MG tablet Take 0.5 tablets by mouth every morning.     . metoprolol succinate (TOPROL-XL) 25 MG 24 hr tablet Take 25 mg by mouth every morning.     . sertraline (ZOLOFT) 50 MG tablet Take 50 mg by mouth at bedtime.     . simvastatin (ZOCOR) 20 MG tablet Take 20 mg by mouth at bedtime.     Marland Kitchen warfarin (COUMADIN) 2 MG tablet Take 1-2 mg by mouth See admin instructions. Takes 2 mg everyday except on Wed and Sun when she takes 1 mg.     No current facility-administered medications for this visit.    Past Medical History  Diagnosis Date  . MI (myocardial infarction) 1993    CABG in [place]  . Pacemaker   .  CAD (coronary artery disease)   . PAD (peripheral artery disease)   . IDDM (insulin dependent diabetes mellitus)   . Hypertension   . Former smoker   . Bladder cancer     surgery & chemo  . Hyperlipidemia   . PAF (paroxysmal atrial fibrillation)     Past Surgical History  Procedure Laterality Date  . Yag laser application Right 13/24/4010    Procedure: YAG LASER APPLICATION;  Surgeon: Elta Guadeloupe T. Gershon Crane, MD;  Location: [facility];  Service: Ophthalmology;  Laterality: Right;  . Bladder repair  1990  . Abdominal hysterectomy  1900  . Bladder surgery  2007    r/t bladder cancer    . [place]  [place] LIMA to LAD, VG to PDA, VG and marginal   . Transthoracic echocardiogram  01/2013    EF 40-45%, mild LVH; ventricular spetum with abnormal function & dysynergy; calcified MV annulus, mild MR; PA peak pressure 8mmHg  . Nm myocar perf wall motion  12/2012    lexiscan - low riskwith medium-large size, severe in intensity fixed defect consistent with previous infarction & small size, mild intensity fixed defect in distal anterior to anteroapical region consistent with previous infarction  . Cardiac catheterization  04/03/2006    [facility] - L main diffusely disease, LAD w/50-60% diffuse disease and 80% lesion in prox/mid region; LCfx totally occluded proximally; RCA with 70% prox lesion after which RCA was totally occluded; patent LIMA-LAD; VG to RCA occluded; EF 40% (Dr. Marisa Cyphers)  . Upper extremity arterial doppler  01/2013    non-compressible calcified bilateral brachial pressure, bilat digits WNL  . Carotid doppler  07/2012    mild amount of fibrous plaque in bilateral bulb/prox ICA; LECA with elevated velocities  . Lower extremity arterial doppler  07/2012    moderate to severe arterial insufficiency, diffuse plaque in R SFA & less than 50% diameter reduction; L SFA with diffuse plaque & 70-99% diameter reduction  . Pacemaker insertion  07/30/2007    [employer] Adapta ADDR01 - Dr. Gerrie Nordmann - symptomatic bradycardia  . Carpal tunnel release      Family History  Problem Relation Age of Onset  . Hypertension Mother   . Skin cancer Mother   . Pneumonia Mother   . Diabetes Father   . Heart attack Father 6  . Appendicitis Maternal Grandmother   . Diabetes Paternal Grandmother   . Heart disease Paternal Grandmother   . Asthma Paternal Grandfather   . Diabetes Sister   . Bladder Cancer Sister   . Hypertension Child     History   Social History  . Marital Status: Married    Spouse Name: N/A    Number of Children: 3  .  Years of Education: 12   Occupational History  . Not on file.   Social History Main Topics  . Smoking status: Former Smoker -- 38 years    Types: Cigarettes    Quit date: 11/28/1990  . Smokeless tobacco: Not on file  . Alcohol Use: No  . Drug Use: No  . Sexual Activity: Not on file   Other Topics Concern  . Not on file   Social History Narrative    Review of systems: The patient specifically denies any chest pain at rest or with exertion, dyspnea at rest or with exertion, orthopnea, paroxysmal nocturnal dyspnea, syncope, palpitations, focal neurological deficits, intermittent claudication, lower extremity edema, unexplained weight gain, cough, hemoptysis  or wheezing.  The patient also denies abdominal pain, nausea, vomiting, dysphagia, diarrhea, constipation, polyuria, polydipsia, dysuria, hematuria, frequency, urgency, abnormal bleeding or bruising, fever, chills, unexpected weight changes, mood swings, change in skin or hair texture, change in voice quality, auditory or visual problems, allergic reactions or rashes, new musculoskeletal complaints other than usual "aches and pains".  PHYSICAL EXAM BP 122/64 mmHg  Pulse 74  Ht 5' 1.5" (1.562 m)  Wt 153 lb 9.6 oz (69.673 kg)  BMI 28.56 kg/m2 General: Alert, oriented x3, no distress  Head: no evidence of trauma, PERRL, EOMI, no exophtalmos or lid lag, no myxedema, no xanthelasma; normal ears, nose and oropharynx  Neck: normal jugular venous pulsations and no hepatojugular reflux; brisk carotid pulses without delay and bilateral carotid bruits left greater than right  Chest: clear to auscultation, no signs of consolidation by percussion or palpation, normal fremitus, symmetrical and full respiratory excursions; healthy left subclavian pacemaker site  Cardiovascular: normal position and quality of the apical impulse, regular rhythm, normal first and paradoxically split second heart sounds, no rubs or gallops, 2/6 early peaking  systolic ejection murmur in the aortic focus  Abdomen: no tenderness or distention, no masses by palpation, no abnormal pulsatility or arterial bruits, normal bowel sounds, no hepatosplenomegaly  Extremities: no clubbing, cyanosis or edema; 2+ radial, ulnar and brachial pulses bilaterally; 2+ right femoral, 1+ right posterior tibial and dorsalis pedis pulses; 2+ left femoral, very weak leftposterior tibial and dorsalis pedis pulses; no subclavian or femoral bruits  Neurological: grossly nonfocal   EKG: A pced Vsensed, LBBB, QRS 167ms, QTC 499ms BMET    Component Value Date/Time   NA 138 11/29/2010 0930   K 4.9 11/29/2010 0930   CL 103 11/29/2010 0930   CO2 30 11/29/2010 0930   GLUCOSE 121* 11/29/2010 0930   BUN 27* 11/29/2010 0930   CREATININE 0.87 11/29/2010 0930   CALCIUM 9.9 11/29/2010 0930   GFRNONAA >60 11/29/2010 0930   GFRAA  11/29/2010 0930    >60        The eGFR has been calculated using the MDRD equation. This calculation has not been validated in all clinical situations. eGFR's persistently <60 mL/min signify possible Chronic Kidney Disease.     ASSESSMENT AND PLAN Atrial fibrillation  She has not had atrial fibrillation in a while. Continue metoprolol. Therapeutically anticoagulated with warfarin without complications.  Willing to try pacemaker remote transmissions.  Hyperlipidemia  Will request copy of labs from Dr. Nevada Crane.  CAD s/p CABG 1993  No symptoms of angina or congestive heart failure. She has a known chronic total occlusion of the right coronary artery with an occluded vein graft to that vessel, inferior scar and anterior apical scar with an ejection fraction estimated around 45%.   PAD (peripheral artery disease)  She has an asymptomatic moderate to severe stenosis in the left superficial femoral artery. Plan reevaluation with duplex ultrasound in February 2016.  Orders Placed This Encounter  Procedures  . EKG 12-Lead   Meds ordered this  encounter  Medications  . insulin lispro (HUMALOG) 100 UNIT/ML KiwkPen    Sig: Inject 3-7 Units into the skin 3 (three) times daily before meals.  . ferrous sulfate 325 (65 FE) MG tablet    Sig: Take 325 mg by mouth daily with breakfast.    Holli Humbles, MD, [facility] [phone] office [phone] pager

## 2014-07-12 DIAGNOSIS — I482 Chronic atrial fibrillation: Secondary | ICD-10-CM | POA: Diagnosis not present

## 2014-07-17 ENCOUNTER — Encounter: Payer: Self-pay | Admitting: Cardiovascular Disease

## 2014-07-25 DIAGNOSIS — H109 Unspecified conjunctivitis: Secondary | ICD-10-CM | POA: Diagnosis not present

## 2014-08-03 DIAGNOSIS — E119 Type 2 diabetes mellitus without complications: Secondary | ICD-10-CM | POA: Diagnosis not present

## 2014-08-03 DIAGNOSIS — J029 Acute pharyngitis, unspecified: Secondary | ICD-10-CM | POA: Diagnosis not present

## 2014-08-03 DIAGNOSIS — B309 Viral conjunctivitis, unspecified: Secondary | ICD-10-CM | POA: Diagnosis not present

## 2014-08-03 DIAGNOSIS — E785 Hyperlipidemia, unspecified: Secondary | ICD-10-CM | POA: Diagnosis not present

## 2014-08-03 DIAGNOSIS — I10 Essential (primary) hypertension: Secondary | ICD-10-CM | POA: Diagnosis not present

## 2014-08-09 DIAGNOSIS — I482 Chronic atrial fibrillation: Secondary | ICD-10-CM | POA: Diagnosis not present

## 2014-08-09 DIAGNOSIS — E1165 Type 2 diabetes mellitus with hyperglycemia: Secondary | ICD-10-CM | POA: Diagnosis not present

## 2014-08-27 ENCOUNTER — Encounter (HOSPITAL_COMMUNITY): Payer: Self-pay | Admitting: Emergency Medicine

## 2014-08-27 ENCOUNTER — Emergency Department (HOSPITAL_COMMUNITY): Payer: Medicare Other

## 2014-08-27 ENCOUNTER — Emergency Department (HOSPITAL_COMMUNITY)
Admission: EM | Admit: 2014-08-27 | Discharge: 2014-08-27 | Disposition: A | Discharge status: Home / Self Care | Payer: Medicare Other | Attending: Emergency Medicine | Admitting: Emergency Medicine

## 2014-08-27 DIAGNOSIS — E119 Type 2 diabetes mellitus without complications: Secondary | ICD-10-CM | POA: Diagnosis not present

## 2014-08-27 DIAGNOSIS — Z87891 Personal history of nicotine dependence: Secondary | ICD-10-CM | POA: Diagnosis not present

## 2014-08-27 DIAGNOSIS — E785 Hyperlipidemia, unspecified: Secondary | ICD-10-CM | POA: Insufficient documentation

## 2014-08-27 DIAGNOSIS — Z79899 Other long term (current) drug therapy: Secondary | ICD-10-CM | POA: Insufficient documentation

## 2014-08-27 DIAGNOSIS — R0602 Shortness of breath: Secondary | ICD-10-CM | POA: Diagnosis not present

## 2014-08-27 DIAGNOSIS — J40 Bronchitis, not specified as acute or chronic: Secondary | ICD-10-CM

## 2014-08-27 DIAGNOSIS — R0689 Other abnormalities of breathing: Secondary | ICD-10-CM | POA: Diagnosis not present

## 2014-08-27 DIAGNOSIS — I252 Old myocardial infarction: Secondary | ICD-10-CM | POA: Diagnosis not present

## 2014-08-27 DIAGNOSIS — Z951 Presence of aortocoronary bypass graft: Secondary | ICD-10-CM | POA: Insufficient documentation

## 2014-08-27 DIAGNOSIS — J320 Chronic maxillary sinusitis: Secondary | ICD-10-CM | POA: Diagnosis not present

## 2014-08-27 DIAGNOSIS — F17210 Nicotine dependence, cigarettes, uncomplicated: Secondary | ICD-10-CM | POA: Diagnosis not present

## 2014-08-27 DIAGNOSIS — J0100 Acute maxillary sinusitis, unspecified: Secondary | ICD-10-CM | POA: Diagnosis not present

## 2014-08-27 DIAGNOSIS — J209 Acute bronchitis, unspecified: Secondary | ICD-10-CM | POA: Insufficient documentation

## 2014-08-27 DIAGNOSIS — I10 Essential (primary) hypertension: Secondary | ICD-10-CM | POA: Insufficient documentation

## 2014-08-27 DIAGNOSIS — Z8551 Personal history of malignant neoplasm of bladder: Secondary | ICD-10-CM | POA: Insufficient documentation

## 2014-08-27 DIAGNOSIS — J3489 Other specified disorders of nose and nasal sinuses: Secondary | ICD-10-CM | POA: Diagnosis not present

## 2014-08-27 DIAGNOSIS — Z950 Presence of cardiac pacemaker: Secondary | ICD-10-CM | POA: Insufficient documentation

## 2014-08-27 DIAGNOSIS — I2510 Atherosclerotic heart disease of native coronary artery without angina pectoris: Secondary | ICD-10-CM | POA: Diagnosis not present

## 2014-08-27 DIAGNOSIS — R062 Wheezing: Secondary | ICD-10-CM | POA: Diagnosis not present

## 2014-08-27 DIAGNOSIS — Z9889 Other specified postprocedural states: Secondary | ICD-10-CM | POA: Insufficient documentation

## 2014-08-27 DIAGNOSIS — Z7901 Long term (current) use of anticoagulants: Secondary | ICD-10-CM | POA: Diagnosis not present

## 2014-08-27 DIAGNOSIS — Z794 Long term (current) use of insulin: Secondary | ICD-10-CM | POA: Diagnosis not present

## 2014-08-27 DIAGNOSIS — J029 Acute pharyngitis, unspecified: Secondary | ICD-10-CM | POA: Diagnosis present

## 2014-08-27 MED ORDER — AMOXICILLIN-POT CLAVULANATE 875-125 MG PO TABS: 1.0000 | ORAL_TABLET | Freq: Two times a day (BID) | ORAL | Status: DC

## 2014-08-27 NOTE — Discharge Instructions (Signed)
Follow up with ear nose and throat for the sinusitis and for follow-up of the left ear. Take the antibiotic Augmentin as directed. Continue your other medications she been taking. Return for any new or worse symptoms.

## 2014-08-27 NOTE — ED Notes (Signed)
MD at bedside. 

## 2014-08-27 NOTE — ED Notes (Addendum)
Patient c/o sinus pressure, nasal congestion, sore throat, and left ear fullness. Per patient started "around Christmas and has been intermittent. Patient reports being diagnosed with sinus infection on 07/26/2014. Unsure of any fevers. Patient does report productive cough with thick green sputum but states "that is clearing up a little." Denies any drainage from ears. Patient then reports some blood in sputum this morning and states "that is what scared me and made me come in because a I'm on coumadin."

## 2014-08-27 NOTE — ED Provider Notes (Signed)
CSN: [id number]     Arrival date & time 08/27/14  1306 History  This chart was scribed for Fredia Sorrow, MD by Stephania Fragmin, ED Scribe. This patient was seen in room APA16A/APA16A and the patient's care was started at 3:00 PM.    Chief Complaint  Patient presents with  . Nasal Congestion  . Ear Fullness  . Sore Throat   Patient is a 81 y.o. female presenting with cough. The history is provided by the patient. No language interpreter was used.  Cough Cough characteristics:  Productive Sputum characteristics:  Bloody Severity:  Moderate Onset quality:  Gradual Duration:  1 day Timing:  Constant Progression:  Worsening Chronicity:  New Context: upper respiratory infection   Relieved by: mild relief with OTC medications. Worsened by:  Nothing tried Ineffective treatments:  None tried Associated symptoms: ear fullness, ear pain, headaches and sore throat   Associated symptoms: no chest pain, no chills, no fever, no rash and no shortness of breath   Ear pain:    Location:  Left   Severity:  Moderate   Timing:  Constant   Progression:  Worsening Headaches:    Severity:  Moderate   Timing:  Constant   Chronicity:  New Risk factors: recent infection      HPI Comments: Megan Haley is a 81 y.o. female who presents to the Emergency Department complaining of productive coughing with blood tinged sputum that began this morning. Patient first had a cold about 1 month ago, on December 29, got better, and became sick again over a week ago. She complains of associated left ear pain and congestion, headache, sinus pain that is particularly worse over the left maxillary area, nasal congestion, and sore throat. As advised by her PCP, patient had tried 3 tsp of OTC Children's Cold and Cough medicine twice a day, Delsym, allergy medication, and Mucinex, with some relief. Patient also applied extra virgin olive oil on her left ear, which was ineffective. Dr. Nevada Crane is her PCP.    Past Medical  History  Diagnosis Date  . MI (myocardial infarction) 1993    CABG in [place]  . Pacemaker   . CAD (coronary artery disease)   . PAD (peripheral artery disease)   . IDDM (insulin dependent diabetes mellitus)   . Hypertension   . Former smoker   . Bladder cancer     surgery & chemo  . Hyperlipidemia   . PAF (paroxysmal atrial fibrillation)    Past Surgical History  Procedure Laterality Date  . Yag laser application Right 70/62/3762    Procedure: YAG LASER APPLICATION;  Surgeon: Elta Guadeloupe T. Gershon Crane, MD;  Location: [facility];  Service: Ophthalmology;  Laterality: Right;  . Bladder repair  1990  . Abdominal hysterectomy  1900  . Bladder surgery  2007    r/t bladder cancer  . [place]  - LIMA to LAD, VG to PDA, VG and marginal   . Transthoracic echocardiogram  01/2013    EF 40-45%, mild LVH; ventricular spetum with abnormal function & dysynergy; calcified MV annulus, mild MR; PA peak pressure 48mmHg  . Nm myocar perf wall motion  12/2012    lexiscan - low riskwith medium-large size, severe in intensity fixed defect consistent with previous infarction & small size, mild intensity fixed defect in distal anterior to anteroapical region consistent with previous infarction  . Cardiac catheterization  04/03/2006    [facility] - L main diffusely disease,  LAD w/50-60% diffuse disease and 80% lesion in prox/mid region; LCfx totally occluded proximally; RCA with 70% prox lesion after which RCA was totally occluded; patent LIMA-LAD; VG to RCA occluded; EF 40% (Dr. Marisa Cyphers)  . Upper extremity arterial doppler  01/2013    non-compressible calcified bilateral brachial pressure, bilat digits WNL  . Carotid doppler  07/2012    mild amount of fibrous plaque in bilateral bulb/prox ICA; LECA with elevated velocities  . Lower extremity arterial doppler  07/2012    moderate to severe arterial insufficiency, diffuse plaque in R SFA & less than 50% diameter reduction; L  SFA with diffuse plaque & 70-99% diameter reduction  . Pacemaker insertion  07/30/2007    [employer] Adapta ADDR01 - Dr. Gerrie Nordmann - symptomatic bradycardia  . Carpal tunnel release     Family History  Problem Relation Age of Onset  . Hypertension Mother   . Skin cancer Mother   . Pneumonia Mother   . Diabetes Father   . Heart attack Father 84  . Appendicitis Maternal Grandmother   . Diabetes Paternal Grandmother   . Heart disease Paternal Grandmother   . Asthma Paternal Grandfather   . Diabetes Sister   . Bladder Cancer Sister   . Hypertension Child    History  Substance Use Topics  . Smoking status: Former Smoker -- 38 years    Types: Cigarettes    Quit date: 11/28/1990  . Smokeless tobacco: Never Used  . Alcohol Use: No   OB History    Gravida Para Term Preterm AB TAB SAB Ectopic Multiple Living   3 3  3      3      Review of Systems  Constitutional: Negative for fever and chills.  HENT: Positive for ear pain, facial swelling, sinus pressure and sore throat.   Eyes: Negative for visual disturbance.  Respiratory: Positive for cough. Negative for shortness of breath.   Cardiovascular: Negative for chest pain.  Gastrointestinal: Negative for nausea, vomiting, abdominal pain and diarrhea.  Genitourinary: Negative for dysuria and hematuria.  Skin: Negative for rash.  Neurological: Positive for headaches.  Hematological: Does not bruise/bleed easily.      Allergies  Lisinopril  Home Medications   Prior to Admission medications   Medication Sig Start Date End Date Taking? Authorizing Provider  aspirin EC 81 MG tablet Take 81 mg by mouth at bedtime.    Yes Historical Provider, MD  Biotin 1000 MCG tablet Take 1,000 mcg by mouth every morning.    Yes Historical Provider, MD  Cholecalciferol (VITAMIN D-3) 1000 UNITS CAPS Take 2,000 Units by mouth every morning.    Yes Historical Provider, MD  Choline Fenofibrate (TRILIPIX) 135 MG capsule Take 135 mg by mouth every  morning.   Yes Historical Provider, MD  ferrous sulfate 325 (65 FE) MG tablet Take 325 mg by mouth daily with breakfast.   Yes Historical Provider, MD  LANTUS 100 UNIT/ML injection Inject 40 Units into the skin at bedtime.  05/12/13  Yes Historical Provider, MD  lisinopril (PRINIVIL,ZESTRIL) 10 MG tablet Take 5 tablets by mouth daily.  11/24/13  Yes Historical Provider, MD  metoprolol succinate (TOPROL-XL) 25 MG 24 hr tablet Take 25 mg by mouth every morning.  05/08/13  Yes Historical Provider, MD  sertraline (ZOLOFT) 50 MG tablet Take 50 mg by mouth at bedtime.  04/12/13  Yes Historical Provider, MD  simvastatin (ZOCOR) 20 MG tablet Take 20 mg by mouth at bedtime.  06/03/13  Yes Historical  Provider, MD  warfarin (COUMADIN) 2 MG tablet Take 1-2 mg by mouth See admin instructions. Patient takes 2mg  on Tues.,Thurs.,Sat. And 1/2 tablet(1mg )all other days 05/27/13  Yes Historical Provider, MD  amoxicillin-clavulanate (AUGMENTIN) 875-125 MG per tablet Take 1 tablet by mouth every 12 (twelve) hours. 08/27/14   Fredia Sorrow, MD  insulin lispro (HUMALOG) 100 UNIT/ML KiwkPen Inject 3-7 Units into the skin 3 (three) times daily before meals.    Historical Provider, MD   BP 137/36 mmHg  Pulse 73  Temp(Src) 98.3 F (36.8 C) (Oral)  Resp 20  Ht 5' 2.5" (1.588 m)  Wt 149 lb (67.586 kg)  BMI 26.80 kg/m2  SpO2 93% Physical Exam  Constitutional: She is oriented to person, place, and time. She appears well-developed and well-nourished. No distress.  HENT:  Head: Normocephalic and atraumatic.  Right Ear: Tympanic membrane normal.  Left Ear: Tympanic membrane is injected.  Mouth/Throat: Oropharynx is clear and moist.  Eyes: Conjunctivae and EOM are normal.  Neck: Neck supple. No tracheal deviation present.  Cardiovascular: Normal rate, regular rhythm and normal heart sounds.   No murmur heard. Pulmonary/Chest: Effort normal and breath sounds normal. No respiratory distress. She has no wheezes. She has no  rhonchi.  Abdominal: Soft. Bowel sounds are normal. There is no tenderness.  Musculoskeletal: Normal range of motion. She exhibits no edema.  No swelling in the ankles.  Neurological: She is alert and oriented to person, place, and time.  Skin: Skin is warm and dry.  Psychiatric: She has a normal mood and affect. Her behavior is normal.  Nursing note and vitals reviewed.   ED Course  Procedures (including critical care time)  DIAGNOSTIC STUDIES: Oxygen Saturation is 93% on room air, adequate by my interpretation.    COORDINATION OF CARE: 3:07 PM - Discussed treatment plan with pt at bedside which includes CT scan and pt agreed to plan. Informed patient that pneumonia has been ruled out by CXR.   Imaging Review Dg Chest 2 View  08/27/2014   CLINICAL DATA:  Congestion, wheezing and shortness of breath since 07/21/2014.  EXAM: CHEST  2 VIEW  COMPARISON:  Single view of the chest 06/19/2010 and 07/31/2007. CT lumbar spine 03/05/2014.  FINDINGS: The patient is status post CABG with a pacing device in place. The lungs are clear. Heart size is normal. No pneumothorax or pleural effusion.  Remote L2 compression fracture is identified as seen on the prior lumbar spine CT scan. There has been increase in vertebral body height loss since the prior study.  IMPRESSION: No acute disease.   Electronically Signed   By: Inge Rise M.D.   On: 08/27/2014 14:09   Ct Maxillofacial Wo Cm  08/27/2014   CLINICAL DATA:  81 year old female complaining of nasal congestion and left year fullness. Sore throat. Bloody sputum. Recent diagnosis of sinus infection on 07/26/2014.  EXAM: CT MAXILLOFACIAL WITHOUT CONTRAST  TECHNIQUE: Multidetector CT imaging of the maxillofacial structures was performed. Multiplanar CT image reconstructions were also generated. A small metallic BB was placed on the right temple in order to reliably differentiate right from left.  COMPARISON:  No priors.  FINDINGS: Multifocal mucosal  thickening throughout the visualized paranasal sinuses. There are small air-fluid levels lying dependently in the left mid frontal sinus, several air-fluid levels in the left ethmoid sinuses, sphenoid sinus, and a large amount of intermediate to high attenuation fluid (42-46 HU) in the left maxillary sinus with an air-fluid level. No acute displaced facial bone fractures are identified.  Pterygoid plates are intact. Mandibular condyles are located bilaterally. Bilateral globes and retro-orbital soft tissues are grossly normal in appearance. Visualized intracranial contents are remarkable for some cerebral atrophy.  IMPRESSION: 1. Extensive paranasal sinus disease, as above, without evidence of acute sinusitis most severe in the left maxillary sinus. The intermediate to high attenuation secretions in the left maxillary sinus could be related to proteinaceous or mildly hemorrhagic contents.   Electronically Signed   By: Vinnie Langton M.D.   On: 08/27/2014 15:42     EKG Interpretation None      MDM   Final diagnoses:  Bronchitis  Maxillary sinusitis, unspecified chronicity   patient's CT scan raising is significant evidence of mucus in the left maxillary sinus. Not distinctly acute sinusitis but since patient is complaining of that area will treat with antibiotic. Patient also with possible early left otitis media. Patient chest x-rays negative for pneumonia so the productive cough is probably bronchitis. Overall symptoms seem to be consistent with an upper respiratory infection. Patient nontoxic no acute distress.  I personally performed the services described in this documentation, which was scribed in my presence. The recorded information has been reviewed and is accurate.       Fredia Sorrow, MD 08/27/14 [phone]

## 2014-08-29 ENCOUNTER — Encounter: Payer: No Typology Code available for payment source | Admitting: Cardiovascular Disease

## 2014-08-31 ENCOUNTER — Telehealth (HOSPITAL_COMMUNITY): Payer: Self-pay | Admitting: *Deleted

## 2014-09-06 DIAGNOSIS — I482 Chronic atrial fibrillation: Secondary | ICD-10-CM | POA: Diagnosis not present

## 2014-09-06 DIAGNOSIS — Z7901 Long term (current) use of anticoagulants: Secondary | ICD-10-CM | POA: Diagnosis not present

## 2014-09-21 ENCOUNTER — Ambulatory Visit (INDEPENDENT_AMBULATORY_CARE_PROVIDER_SITE_OTHER): Payer: Medicare Other | Admitting: Otolaryngology

## 2014-09-21 DIAGNOSIS — H903 Sensorineural hearing loss, bilateral: Secondary | ICD-10-CM | POA: Diagnosis not present

## 2014-09-21 DIAGNOSIS — H9071 Mixed conductive and sensorineural hearing loss, unilateral, right ear, with unrestricted hearing on the contralateral side: Secondary | ICD-10-CM | POA: Diagnosis not present

## 2014-09-21 DIAGNOSIS — J310 Chronic rhinitis: Secondary | ICD-10-CM

## 2014-09-21 DIAGNOSIS — R040 Epistaxis: Secondary | ICD-10-CM | POA: Diagnosis not present

## 2014-10-03 ENCOUNTER — Ambulatory Visit (INDEPENDENT_AMBULATORY_CARE_PROVIDER_SITE_OTHER): Payer: Medicare Other | Admitting: Urology

## 2014-10-03 DIAGNOSIS — C670 Malignant neoplasm of trigone of bladder: Secondary | ICD-10-CM | POA: Diagnosis not present

## 2014-10-03 DIAGNOSIS — C672 Malignant neoplasm of lateral wall of bladder: Secondary | ICD-10-CM

## 2014-10-04 DIAGNOSIS — I482 Chronic atrial fibrillation: Secondary | ICD-10-CM | POA: Diagnosis not present

## 2014-10-04 DIAGNOSIS — Z7901 Long term (current) use of anticoagulants: Secondary | ICD-10-CM | POA: Diagnosis not present

## 2014-10-05 ENCOUNTER — Ambulatory Visit (INDEPENDENT_AMBULATORY_CARE_PROVIDER_SITE_OTHER): Payer: Medicare Other | Admitting: *Deleted

## 2014-10-05 DIAGNOSIS — I480 Paroxysmal atrial fibrillation: Secondary | ICD-10-CM | POA: Diagnosis not present

## 2014-10-05 LAB — MDC_IDC_ENUM_SESS_TYPE_REMOTE
Battery Impedance: 2424 Ohm
Battery Remaining Longevity: 21 mo
Battery Voltage: 2.72 V
Brady Statistic AP VP Percent: 0 %
Brady Statistic AP VS Percent: 99 %
Brady Statistic AS VP Percent: 0 %
Brady Statistic AS VS Percent: 0 %
Date Time Interrogation Session: 20160310144143
Lead Channel Impedance Value: 490 Ohm
Lead Channel Pacing Threshold Pulse Width: 0.4 ms
Lead Channel Sensing Intrinsic Amplitude: 11.2 mV
Lead Channel Setting Pacing Amplitude: 2 V
Lead Channel Setting Sensing Sensitivity: 5.6 mV
MDC IDC MSMT LEADCHNL RA PACING THRESHOLD AMPLITUDE: 0.5 V
MDC IDC MSMT LEADCHNL RV IMPEDANCE VALUE: 535 Ohm
MDC IDC MSMT LEADCHNL RV PACING THRESHOLD AMPLITUDE: 0.625 V
MDC IDC MSMT LEADCHNL RV PACING THRESHOLD PULSEWIDTH: 0.4 ms
MDC IDC SET LEADCHNL RA PACING AMPLITUDE: 1.5 V
MDC IDC SET LEADCHNL RV PACING PULSEWIDTH: 0.4 ms

## 2014-10-05 NOTE — Progress Notes (Signed)
Remote pacemaker transmission.   

## 2014-10-13 ENCOUNTER — Encounter: Payer: Self-pay | Admitting: Cardiology

## 2014-10-16 ENCOUNTER — Other Ambulatory Visit: Payer: Self-pay | Admitting: Urology

## 2014-10-19 ENCOUNTER — Ambulatory Visit (INDEPENDENT_AMBULATORY_CARE_PROVIDER_SITE_OTHER): Payer: Medicare Other | Admitting: Otolaryngology

## 2014-10-25 ENCOUNTER — Encounter: Payer: Self-pay | Admitting: Cardiovascular Disease

## 2014-10-25 NOTE — Patient Instructions (Addendum)
LINDELL TUSSEY  10/25/2014   Your procedure is scheduled on:  10/31/2014  Report to [facility] at  1045  AM.  Call this number if you have problems the morning of surgery: [phone]   Remember:   Do not eat food or drink liquids after midnight.   Take these medicines the morning of surgery with A SIP OF WATER:  Lisinopril, metoprolol, zoloft. Take 1/2 your usual insulin dosage the night before your procedure.   Do not wear jewelry, make-up or nail polish.  Do not wear lotions, powders, or perfumes.  Do not shave 48 hours prior to surgery. Men may shave face and neck.  Do not bring valuables to the hospital.  [facility] is not responsible  for any belongings or valuables.               Contacts, dentures or bridgework may not be worn into surgery.  Leave suitcase in the car. After surgery it may be brought to your room.  For patients admitted to the hospital, discharge time is determined by your treatment team.               Patients discharged the day of surgery will not be allowed to drive home.  Name and phone number of your driver: family  Special Instructions: Shower using CHG 2 nights before surgery and the night before surgery.  If you shower the day of surgery use CHG.  Use special wash - you have one bottle of CHG for all showers.  You should use approximately 1/3 of the bottle for each shower.   Please read over the following fact sheets that you were given: Pain Booklet, Coughing and Deep Breathing, Surgical Site Infection Prevention, Anesthesia Post-op Instructions and Care and Recovery After Surgery Cystoscopy Cystoscopy is a procedure that is used to help your caregiver diagnose and sometimes treat conditions that affect your lower urinary tract. Your lower urinary tract includes your bladder and the tube through which urine passes from your bladder out of your body (urethra). Cystoscopy is performed with a thin, tube-shaped instrument (cystoscope). The cystoscope  has lenses and a light at the end so that your caregiver can see inside your bladder. The cystoscope is inserted at the entrance of your urethra. Your caregiver guides it through your urethra and into your bladder. There are two main types of cystoscopy:  Flexible cystoscopy (with a flexible cystoscope).  Rigid cystoscopy (with a rigid cystoscope). Cystoscopy may be recommended for many conditions, including:  Urinary tract infections.  Blood in your urine (hematuria).  Loss of bladder control (urinary incontinence) or overactive bladder.  Unusual cells found in a urine sample.  Urinary blockage.  Painful urination. Cystoscopy may also be done to remove a sample of your tissue to be checked under a microscope (biopsy). It may also be done to remove or destroy bladder stones. LET YOUR CAREGIVER KNOW ABOUT:  Allergies to food or medicine.  Medicines taken, including vitamins, herbs, eyedrops, over-the-counter medicines, and creams.  Use of steroids (by mouth or creams).  Previous problems with anesthetics or numbing medicines.  History of bleeding problems or blood clots.  Previous surgery.  Other health problems, including diabetes and kidney problems.  Possibility of pregnancy, if this applies. PROCEDURE The area around the opening to your urethra will be cleaned. A medicine to numb your urethra (local anesthetic) is used. If a tissue sample or stone is removed during the procedure, you may be given  a medicine to make you sleep (general anesthetic). Your caregiver will gently insert the tip of the cystoscope into your urethra. The cystoscope will be slowly glided through your urethra and into your bladder. Sterile fluid will flow through the cystoscope and into your bladder. The fluid will expand and stretch your bladder. This gives your caregiver a better view of your bladder walls. The procedure lasts about 15-20 minutes. AFTER THE PROCEDURE If a local anesthetic is used,  you will be allowed to go home as soon as you are ready. If a general anesthetic is used, you will be taken to a recovery area until you are stable. You may have temporary bleeding and burning on urination. Document Released: 07/11/2000 Document Revised: 04/07/2012 Document Reviewed: 01/05/2012 [facility] Patient Information 2015 [place], [place]. This information is not intended to replace advice given to you by your health care provider. Make sure you discuss any questions you have with your health care provider. PATIENT INSTRUCTIONS POST-ANESTHESIA  IMMEDIATELY FOLLOWING SURGERY:  Do not drive or operate machinery for the first twenty four hours after surgery.  Do not make any important decisions for twenty four hours after surgery or while taking narcotic pain medications or sedatives.  If you develop intractable nausea and vomiting or a severe headache please notify your doctor immediately.  FOLLOW-UP:  Please make an appointment with your surgeon as instructed. You do not need to follow up with anesthesia unless specifically instructed to do so.  WOUND CARE INSTRUCTIONS (if applicable):  Keep a dry clean dressing on the anesthesia/puncture wound site if there is drainage.  Once the wound has quit draining you may leave it open to air.  Generally you should leave the bandage intact for twenty four hours unless there is drainage.  If the epidural site drains for more than 36-48 hours please call the anesthesia department.  QUESTIONS?:  Please feel free to call your physician or the hospital operator if you have any questions, and they will be happy to assist you.

## 2014-10-26 ENCOUNTER — Encounter (HOSPITAL_COMMUNITY)
Admission: RE | Admit: 2014-10-26 | Discharge: 2014-10-26 | Disposition: A | Discharge status: Home / Self Care | Payer: Medicare Other | Source: Ambulatory Visit | Attending: Urology | Admitting: Urology

## 2014-10-26 ENCOUNTER — Encounter (HOSPITAL_COMMUNITY): Payer: Self-pay

## 2014-10-26 ENCOUNTER — Other Ambulatory Visit: Payer: Self-pay

## 2014-10-26 DIAGNOSIS — Z01810 Encounter for preprocedural cardiovascular examination: Secondary | ICD-10-CM | POA: Insufficient documentation

## 2014-10-26 DIAGNOSIS — Z01812 Encounter for preprocedural laboratory examination: Secondary | ICD-10-CM | POA: Insufficient documentation

## 2014-10-26 DIAGNOSIS — Z8551 Personal history of malignant neoplasm of bladder: Secondary | ICD-10-CM | POA: Insufficient documentation

## 2014-10-26 LAB — CBC
HCT: 40.4 % (ref 36.0–46.0)
Hemoglobin: 12.9 g/dL (ref 12.0–15.0)
MCH: 29.1 pg (ref 26.0–34.0)
MCHC: 31.9 g/dL (ref 30.0–36.0)
MCV: 91 fL (ref 78.0–100.0)
Platelets: 240 10*3/uL (ref 150–400)
RBC: 4.44 MIL/uL (ref 3.87–5.11)
RDW: 15.4 % (ref 11.5–15.5)
WBC: 6.8 10*3/uL (ref 4.0–10.5)

## 2014-10-26 LAB — BASIC METABOLIC PANEL
ANION GAP: 6 (ref 5–15)
BUN: 39 mg/dL — ABNORMAL HIGH (ref 6–23)
CO2: 25 mmol/L (ref 19–32)
Calcium: 8.9 mg/dL (ref 8.4–10.5)
Chloride: 105 mmol/L (ref 96–112)
Creatinine, Ser: 1.26 mg/dL — ABNORMAL HIGH (ref 0.50–1.10)
GFR calc Af Amer: 45 mL/min — ABNORMAL LOW (ref >90)
GFR, EST NON AFRICAN AMERICAN: 39 mL/min — AB (ref >90)
Glucose, Bld: 129 mg/dL — ABNORMAL HIGH (ref 70–99)
Potassium: 5 mmol/L (ref 3.5–5.1)
SODIUM: 136 mmol/L (ref 135–145)

## 2014-10-26 NOTE — Pre-Procedure Instructions (Signed)
Patient's extensive medical history reviewed with Dr Patsey Berthold, including EKG tracing and allergy to propofol which patient states causes severe drop in BP and HR as well as swelling and nausea and vomiting. No further orders given.

## 2014-10-31 ENCOUNTER — Encounter (HOSPITAL_COMMUNITY)
Admission: RE | Disposition: A | Discharge status: Home / Self Care | Payer: Self-pay | Source: Ambulatory Visit | Attending: Urology

## 2014-10-31 ENCOUNTER — Ambulatory Visit (HOSPITAL_COMMUNITY): Payer: Medicare Other | Admitting: Anesthesiology

## 2014-10-31 ENCOUNTER — Encounter (HOSPITAL_COMMUNITY): Payer: Self-pay | Admitting: *Deleted

## 2014-10-31 ENCOUNTER — Ambulatory Visit (HOSPITAL_COMMUNITY)
Admission: RE | Admit: 2014-10-31 | Discharge: 2014-10-31 | Disposition: A | Discharge status: Home / Self Care | Payer: Medicare Other | Source: Ambulatory Visit | Attending: Urology | Admitting: Urology

## 2014-10-31 ENCOUNTER — Ambulatory Visit (HOSPITAL_COMMUNITY): Payer: Medicare Other

## 2014-10-31 DIAGNOSIS — Z950 Presence of cardiac pacemaker: Secondary | ICD-10-CM | POA: Diagnosis not present

## 2014-10-31 DIAGNOSIS — I480 Paroxysmal atrial fibrillation: Secondary | ICD-10-CM | POA: Insufficient documentation

## 2014-10-31 DIAGNOSIS — Z7982 Long term (current) use of aspirin: Secondary | ICD-10-CM | POA: Diagnosis not present

## 2014-10-31 DIAGNOSIS — Z9010 Acquired absence of unspecified breast and nipple: Secondary | ICD-10-CM | POA: Diagnosis not present

## 2014-10-31 DIAGNOSIS — I2510 Atherosclerotic heart disease of native coronary artery without angina pectoris: Secondary | ICD-10-CM | POA: Insufficient documentation

## 2014-10-31 DIAGNOSIS — R828 Abnormal findings on cytological and histological examination of urine: Secondary | ICD-10-CM | POA: Diagnosis not present

## 2014-10-31 DIAGNOSIS — Z87891 Personal history of nicotine dependence: Secondary | ICD-10-CM | POA: Diagnosis not present

## 2014-10-31 DIAGNOSIS — Z951 Presence of aortocoronary bypass graft: Secondary | ICD-10-CM | POA: Diagnosis not present

## 2014-10-31 DIAGNOSIS — Z794 Long term (current) use of insulin: Secondary | ICD-10-CM | POA: Diagnosis not present

## 2014-10-31 DIAGNOSIS — Z8551 Personal history of malignant neoplasm of bladder: Secondary | ICD-10-CM | POA: Insufficient documentation

## 2014-10-31 DIAGNOSIS — N3289 Other specified disorders of bladder: Secondary | ICD-10-CM | POA: Diagnosis not present

## 2014-10-31 DIAGNOSIS — N3010 Interstitial cystitis (chronic) without hematuria: Secondary | ICD-10-CM | POA: Diagnosis not present

## 2014-10-31 DIAGNOSIS — E785 Hyperlipidemia, unspecified: Secondary | ICD-10-CM | POA: Insufficient documentation

## 2014-10-31 DIAGNOSIS — Z7901 Long term (current) use of anticoagulants: Secondary | ICD-10-CM | POA: Insufficient documentation

## 2014-10-31 DIAGNOSIS — C679 Malignant neoplasm of bladder, unspecified: Secondary | ICD-10-CM | POA: Diagnosis not present

## 2014-10-31 DIAGNOSIS — I252 Old myocardial infarction: Secondary | ICD-10-CM | POA: Diagnosis not present

## 2014-10-31 DIAGNOSIS — I739 Peripheral vascular disease, unspecified: Secondary | ICD-10-CM | POA: Diagnosis not present

## 2014-10-31 DIAGNOSIS — N3020 Other chronic cystitis without hematuria: Secondary | ICD-10-CM | POA: Insufficient documentation

## 2014-10-31 DIAGNOSIS — Z888 Allergy status to other drugs, medicaments and biological substances status: Secondary | ICD-10-CM | POA: Diagnosis not present

## 2014-10-31 DIAGNOSIS — Z9861 Coronary angioplasty status: Secondary | ICD-10-CM | POA: Diagnosis not present

## 2014-10-31 HISTORY — DX: Unspecified hearing loss, right ear: H91.91

## 2014-10-31 HISTORY — DX: Unspecified hearing loss, unspecified ear: H91.90

## 2014-10-31 HISTORY — PX: CYSTOSCOPY W/ RETROGRADES: SHX1426

## 2014-10-31 LAB — GLUCOSE, CAPILLARY
Glucose-Capillary: 128 mg/dL — ABNORMAL HIGH (ref 70–99)
Glucose-Capillary: 80 mg/dL (ref 70–99)

## 2014-10-31 SURGERY — CYSTOSCOPY, WITH RETROGRADE PYELOGRAM
Anesthesia: General | Laterality: Bilateral

## 2014-10-31 MED ORDER — FENTANYL CITRATE 0.05 MG/ML IJ SOLN
INTRAMUSCULAR | Status: AC
  Filled 2014-10-31: qty 2

## 2014-10-31 MED ORDER — MIDAZOLAM HCL 2 MG/2ML IJ SOLN
1.0000 mg | INTRAMUSCULAR | Status: DC | PRN
Start: 2014-10-31 — End: 2014-10-31
  Administered 2014-10-31: 2 mg via INTRAVENOUS

## 2014-10-31 MED ORDER — CEFAZOLIN SODIUM 1-5 GM-% IV SOLN
1.0000 g | INTRAVENOUS | Status: AC
  Administered 2014-10-31: 1 g via INTRAVENOUS

## 2014-10-31 MED ORDER — CEPHALEXIN 250 MG PO CAPS: 250.0000 mg | ORAL_CAPSULE | Freq: Two times a day (BID) | ORAL | Status: DC

## 2014-10-31 MED ORDER — SODIUM CHLORIDE 0.9 % IJ SOLN
INTRAMUSCULAR | Status: AC
  Filled 2014-10-31: qty 10

## 2014-10-31 MED ORDER — LIDOCAINE HCL (CARDIAC) 10 MG/ML IV SOLN
INTRAVENOUS | Status: DC | PRN
  Administered 2014-10-31: 30 mg via INTRAVENOUS

## 2014-10-31 MED ORDER — LACTATED RINGERS IV SOLN
INTRAVENOUS | Status: DC
  Administered 2014-10-31: 12:00:00 via INTRAVENOUS

## 2014-10-31 MED ORDER — FENTANYL CITRATE 0.05 MG/ML IJ SOLN
25.0000 ug | INTRAMUSCULAR | Status: AC
  Administered 2014-10-31: 25 ug via INTRAVENOUS

## 2014-10-31 MED ORDER — ONDANSETRON HCL 4 MG/2ML IJ SOLN
4.0000 mg | Freq: Once | INTRAMUSCULAR | Status: AC
  Administered 2014-10-31: 4 mg via INTRAVENOUS

## 2014-10-31 MED ORDER — CEFAZOLIN SODIUM 1-5 GM-% IV SOLN
INTRAVENOUS | Status: AC
  Filled 2014-10-31: qty 50

## 2014-10-31 MED ORDER — FENTANYL CITRATE 0.05 MG/ML IJ SOLN: 25.0000 ug | INTRAMUSCULAR | Status: DC | PRN

## 2014-10-31 MED ORDER — EPHEDRINE SULFATE 50 MG/ML IJ SOLN
INTRAMUSCULAR | Status: AC
  Filled 2014-10-31: qty 1

## 2014-10-31 MED ORDER — GLYCOPYRROLATE 0.2 MG/ML IJ SOLN
INTRAMUSCULAR | Status: AC
Start: 2014-10-31 — End: 2014-10-31
  Filled 2014-10-31: qty 1

## 2014-10-31 MED ORDER — ONDANSETRON HCL 4 MG/2ML IJ SOLN
INTRAMUSCULAR | Status: AC
Start: 2014-10-31 — End: 2014-10-31
  Filled 2014-10-31: qty 2

## 2014-10-31 MED ORDER — ONDANSETRON HCL 4 MG/2ML IJ SOLN: 4.0000 mg | Freq: Once | INTRAMUSCULAR | Status: DC | PRN

## 2014-10-31 MED ORDER — EPHEDRINE SULFATE 50 MG/ML IJ SOLN
INTRAMUSCULAR | Status: DC | PRN
  Administered 2014-10-31 (×3): 5 mg via INTRAVENOUS

## 2014-10-31 MED ORDER — UTIRA-C 81.6 MG PO TABS: 1.0000 | ORAL_TABLET | Freq: Three times a day (TID) | ORAL | Status: DC | PRN

## 2014-10-31 MED ORDER — PROPOFOL 10 MG/ML IV BOLUS
INTRAVENOUS | Status: DC | PRN
  Administered 2014-10-31: 80 mg via INTRAVENOUS
  Administered 2014-10-31: 10 mg via INTRAVENOUS

## 2014-10-31 MED ORDER — 0.9 % SODIUM CHLORIDE (POUR BTL) OPTIME
TOPICAL | Status: DC | PRN
  Administered 2014-10-31: 1000 mL

## 2014-10-31 MED ORDER — IOHEXOL 350 MG/ML SOLN
INTRAVENOUS | Status: DC | PRN
  Administered 2014-10-31: 50 mL

## 2014-10-31 MED ORDER — FENTANYL CITRATE 0.05 MG/ML IJ SOLN: 25.0000 ug | INTRAMUSCULAR | Status: DC

## 2014-10-31 MED ORDER — GLYCOPYRROLATE 0.2 MG/ML IJ SOLN
0.2000 mg | Freq: Once | INTRAMUSCULAR | Status: AC
  Administered 2014-10-31: 0.2 mg via INTRAVENOUS

## 2014-10-31 MED ORDER — MIDAZOLAM HCL 2 MG/2ML IJ SOLN
INTRAMUSCULAR | Status: AC
  Filled 2014-10-31: qty 2

## 2014-10-31 MED ORDER — PROPOFOL 10 MG/ML IV BOLUS
INTRAVENOUS | Status: AC
  Filled 2014-10-31: qty 20

## 2014-10-31 MED ORDER — FENTANYL CITRATE 0.05 MG/ML IJ SOLN
INTRAMUSCULAR | Status: DC | PRN
  Administered 2014-10-31: 25 ug via INTRAVENOUS

## 2014-10-31 SURGICAL SUPPLY — 18 items
BAG DRAIN CYSTO-URO STER (DRAIN) ×3 IMPLANT
BAG HAMPER (MISCELLANEOUS) ×2 IMPLANT
BAG URINE LEG 500ML (DRAIN) ×2 IMPLANT
CATH FOLEY 2WAY SLVR  5CC 18FR (CATHETERS) ×2
CATH FOLEY 2WAY SLVR 5CC 18FR (CATHETERS) IMPLANT
CATH INTERMIT  6FR 70CM (CATHETERS) ×2 IMPLANT
CLOTH BEACON ORANGE TIMEOUT ST (SAFETY) ×3 IMPLANT
FORMALIN 10 PREFIL 120ML (MISCELLANEOUS) ×6 IMPLANT
GLOVE BIOGEL M 8.0 STRL (GLOVE) ×3 IMPLANT
GOWN STRL REIN XL XLG (GOWN DISPOSABLE) ×2 IMPLANT
GUIDEWIRE STR DUAL SENSOR (WIRE) ×2 IMPLANT
IV NS IRRIG 3000ML ARTHROMATIC (IV SOLUTION) ×2 IMPLANT
KIT CLEAN CATCH URINE (SET/KITS/TRAYS/PACK) ×2 IMPLANT
KIT CLEAN ENDO COMPLIANCE (KITS) ×2 IMPLANT
MANIFOLD NEPTUNE II (INSTRUMENTS) ×3 IMPLANT
PACK CYSTO (CUSTOM PROCEDURE TRAY) ×2 IMPLANT
PAD ARMBOARD 7.5X6 YLW CONV (MISCELLANEOUS) ×2 IMPLANT
WATER STERILE IRR 1000ML POUR (IV SOLUTION) ×2 IMPLANT

## 2014-10-31 NOTE — Op Note (Signed)
PATIENT:  Megan Haley  PRE-OPERATIVE DIAGNOSIS: History of bladder cancer with recent positive cytology  POST-OPERATIVE DIAGNOSIS: Same  PROCEDURE: Cystoscopy, bilateral retrograde ureteropyelograms with renal washings bilaterally, bladder biopsy, interpretive fluoroscopy  SURGEON:  Lillette Boxer. Yahsir Wickens, M.D.  ANESTHESIA:  General  EBL:  Minimal  DRAINS: 18 French Foley catheter  LOCAL MEDICATIONS USED:  None  SPECIMEN:  1. Right renal washing for cytology 2. Left renal washing for cytology 3. Bladder biopsy  INDICATION: Megan Haley is an 81 year old female presents at this time for cystoscopy, bladder biopsy and retrograde ureteropyelograms. She is status post TURBT for bladder cancer in 2007. Specifics of her pathology specimen are unknown. Recent cystoscopy revealed an erythematous area on her right bladder wall, cytologies were positive. It was recommended that she undergo cystoscopy, bladder biopsy and retrogrades. The procedures well as risks and complications have been discussed with the patient. She understands these and desires to proceed.  Findings: Ureters and pyelocalyceal systems were normal bilaterally, without evidence of hydronephrosis, filling defects or stricture.  Description of procedure: The patient was properly identified and marked (if applicable) in the holding area. They were then  taken to the operating room and placed on the table in a supine position. General anesthesia was then administered. Once fully anesthetized the patient was moved to the dorsolithotomy position and the genitalia and perineum were sterilely prepped and draped in standard fashion. An official timeout was then performed.  A 22 French panendoscope was placed in the patient's bladder. Circumferential inspection was performed using 12 lens. The bladder had no papillary lesions. Ureteral orifices were normal in configuration and location. On the right bladder wall, there was a  stellate scar with surrounding slightly raised erythematous urothelium noted. There was no nodularity or papillary nature to this. No other bladder lesions were noted.  With the aid of a sensor-tip guidewire, a 6 [place] open-ended catheter was advanced into the patient's right renal pelvis. Bladder washings were obtained with saline, the washings were sent for cytology labeled "right renal washings". The open-ended catheter was backed down to the ureterovesical junction. Using Omnipaque, a retrograde ureteropyelogram was performed.  This revealed normal findings as dictated above.  The exact same procedure was done on the left side-washings were taken, labeled "left renal washings" and sent for cytology. The retrograde was performed. Findings noted above.  At this point, the open-ended catheter was removed, and using the biopsy forceps, the erythematous urothelium was removed and several bites. These were all labeled "bladder biopsy". They were sent for permanent specimen.  the biopsy site was carefully coagulated with the Bugbee electrode. No significant bleeding was seen after this.  An 45 French Foley catheter was placed and hooked to a leg bag following the addition of 10 mL of water in the balloon.  The patient tolerated procedure well. She was taken to the PACU in stable condition. I will follow-up with her over the phone with biopsy results.

## 2014-10-31 NOTE — Anesthesia Preprocedure Evaluation (Signed)
Anesthesia Evaluation  Patient identified by MRN, date of birth, ID band Patient awake    Reviewed: Allergy & Precautions, NPO status , Patient's Chart, lab work & pertinent test results, reviewed documented beta blocker date and time   Airway Mallampati: II  TM Distance: >3 FB Neck ROM: Full    Dental  (+) Edentulous Upper, Edentulous Lower   Pulmonary former smoker,  breath sounds clear to auscultation        Cardiovascular + CAD, + Past MI, + CABG and + Peripheral Vascular Disease + dysrhythmias (hx PAF,  hx of SSS---pacemaker) + pacemaker Rhythm:Regular Rate:Normal     Neuro/Psych    GI/Hepatic negative GI ROS,   Endo/Other  diabetes, Type 2, Insulin Dependent  Renal/GU      Musculoskeletal   Abdominal   Peds  Hematology   Anesthesia Other Findings   Reproductive/Obstetrics                             Anesthesia Physical Anesthesia Plan  ASA: III  Anesthesia Plan: General   Post-op Pain Management:    Induction: Intravenous  Airway Management Planned: LMA  Additional Equipment:   Intra-op Plan:   Post-operative Plan: Extubation in OR  Informed Consent: I have reviewed the patients History and Physical, chart, labs and discussed the procedure including the risks, benefits and alternatives for the proposed anesthesia with the patient or authorized representative who has indicated his/her understanding and acceptance.     Plan Discussed with:   Anesthesia Plan Comments:         Anesthesia Quick Evaluation

## 2014-10-31 NOTE — Anesthesia Postprocedure Evaluation (Signed)
  Anesthesia Post-op Note  Patient: Megan Haley  Procedure(s) Performed: Procedure(s): CYSTOSCOPY WITH BILATERAL RETROGRADE PYELOGRAM AND RENAL WASHINGS FOR CYTOLOGY WITH BLADDER BIOPSY (Bilateral)  Patient Location: Short Stay  Anesthesia Type:General  Level of Consciousness: awake, alert  and patient cooperative  Airway and Oxygen Therapy: Patient Spontanous Breathing  Post-op Pain: none  Post-op Assessment: Post-op Vital signs reviewed, Patient's Cardiovascular Status Stable, Respiratory Function Stable, Patent Airway and No signs of Nausea or vomiting  Post-op Vital Signs: Reviewed and stable  Last Vitals:  Filed Vitals:   10/31/14 1357  BP: 126/97  Pulse:   Temp: 36.7 C  Resp: 16    Complications: No apparent anesthesia complications

## 2014-10-31 NOTE — Discharge Instructions (Signed)

## 2014-10-31 NOTE — Transfer of Care (Signed)
Immediate Anesthesia Transfer of Care Note  Patient: Megan Haley  Procedure(s) Performed: Procedure(s) (LRB): CYSTOSCOPY WITH BILATERAL RETROGRADE PYELOGRAM AND RENAL WASHINGS FOR CYTOLOGY WITH BLADDER BIOPSY (Bilateral)  Patient Location: PACU  Anesthesia Type: General  Level of Consciousness: awake  Airway & Oxygen Therapy: Patient Spontanous Breathing and non-rebreather face mask  Post-op Assessment: Report given to PACU RN, Post -op Vital signs reviewed and stable and Patient moving all extremities  Post vital signs: Reviewed and stable  Complications: No apparent anesthesia complications

## 2014-10-31 NOTE — Anesthesia Procedure Notes (Signed)
Procedure Name: LMA Insertion Date/Time: 10/31/2014 12:30 PM Performed by: Vista Deck Pre-anesthesia Checklist: Patient identified, Patient being monitored, Emergency Drugs available, Timeout performed and Suction available Patient Re-evaluated:Patient Re-evaluated prior to inductionOxygen Delivery Method: Circle System Utilized Preoxygenation: Pre-oxygenation with 100% oxygen Intubation Type: IV induction Ventilation: Mask ventilation without difficulty LMA: LMA inserted LMA Size: 3.0 Number of attempts: 1 Placement Confirmation: positive ETCO2 and breath sounds checked- equal and bilateral Tube secured with: Tape

## 2014-10-31 NOTE — H&P (Signed)
H&P  Chief Complaint: history of bladder cancer  History of Present Illness: Megan Haley is a 81 y.o. year old femalewho presents at this time for cystoscopy, bladder biopsies and bilateral retrograde ureteropyelograms.  She underwent transurethral resection of a bladder tumor in [place] in 2007.  Apparently, this was high-grade but non-muscle invasive.  She underwent BCG induction at that time.  She has been followed on a regular basis here in [place] since that time, first by Dr. Maryland Pink, most recently by Dr. Michela Pitcher.  All cystoscopies and cytologies have been negative.  She denies gross hematuria, dysuria, any significant lower urinary tract symptomatology.  She usually has a good stream and feels like she empties well.  At her first visit with me last month, she was noted to have an erythematous area on the right side of her bladder, cytology was positive.  Past Medical History  Diagnosis Date  . Pacemaker   . CAD (coronary artery disease)   . PAD (peripheral artery disease)   . IDDM (insulin dependent diabetes mellitus)   . Former smoker   . Hyperlipidemia   . PAF (paroxysmal atrial fibrillation)   . Bladder cancer 2007    surgery & chemo  . MI (myocardial infarction) 1993    CABG in [place]  . Deafness in right ear   . HOH (hard of hearing)     Past Surgical History  Procedure Laterality Date  . Yag laser application Right 16/04/9603    Procedure: YAG LASER APPLICATION;  Surgeon: Elta Guadeloupe T. Gershon Crane, MD;  Location: [facility];  Service: Ophthalmology;  Laterality: Right;  . Bladder repair  1990  . Abdominal hysterectomy  1900  . Bladder surgery  2007    r/t bladder cancer  . [place]  - LIMA to LAD, VG to PDA, VG and marginal   . Transthoracic echocardiogram  01/2013    EF 40-45%, mild LVH; ventricular spetum with abnormal function & dysynergy; calcified MV annulus, mild MR; PA peak pressure 67mmHg  . Nm myocar perf wall motion  12/2012     lexiscan - low riskwith medium-large size, severe in intensity fixed defect consistent with previous infarction & small size, mild intensity fixed defect in distal anterior to anteroapical region consistent with previous infarction  . Cardiac catheterization  04/03/2006    [facility] - L main diffusely disease, LAD w/50-60% diffuse disease and 80% lesion in prox/mid region; LCfx totally occluded proximally; RCA with 70% prox lesion after which RCA was totally occluded; patent LIMA-LAD; VG to RCA occluded; EF 40% (Dr. Marisa Cyphers)  . Upper extremity arterial doppler  01/2013    non-compressible calcified bilateral brachial pressure, bilat digits WNL  . Carotid doppler  07/2012    mild amount of fibrous plaque in bilateral bulb/prox ICA; LECA with elevated velocities  . Lower extremity arterial doppler  07/2012    moderate to severe arterial insufficiency, diffuse plaque in R SFA & less than 50% diameter reduction; L SFA with diffuse plaque & 70-99% diameter reduction  . Pacemaker insertion  07/30/2007    [employer] Adapta ADDR01 - Dr. Gerrie Nordmann - symptomatic bradycardia  . Carpal tunnel release    . Cataract extraction Bilateral     Home Medications:  Medications Prior to Admission  Medication Sig Dispense Refill  . aspirin EC 81 MG tablet Take 81 mg by mouth at bedtime.     . Biotin 1000 MCG tablet Take 1,000  mcg by mouth every morning.     . Cholecalciferol (VITAMIN D-3) 1000 UNITS CAPS Take 2,000 Units by mouth every morning.     . Choline Fenofibrate (TRILIPIX) 135 MG capsule Take 135 mg by mouth every morning.    . ferrous sulfate 325 (65 FE) MG tablet Take 325 mg by mouth daily with breakfast.    . LANTUS 100 UNIT/ML injection Inject 40 Units into the skin at bedtime.     . metoprolol succinate (TOPROL-XL) 25 MG 24 hr tablet Take 25 mg by mouth every morning.     . sertraline (ZOLOFT) 50 MG tablet Take 50 mg by mouth at bedtime.     . simvastatin (ZOCOR) 20 MG tablet Take 20 mg  by mouth at bedtime.     Marland Kitchen warfarin (COUMADIN) 2 MG tablet Take 1-2 mg by mouth See admin instructions. Patient takes 2mg  on Tues.,Thurs.,Sat. And 1/2 tablet(1mg )all other days    . amoxicillin-clavulanate (AUGMENTIN) 875-125 MG per tablet Take 1 tablet by mouth every 12 (twelve) hours. (Patient not taking: Reported on 10/17/2014) 14 tablet 0  . insulin lispro (HUMALOG) 100 UNIT/ML injection Inject 3-7 Units into the skin 3 (three) times daily before meals.    Marland Kitchen lisinopril (PRINIVIL,ZESTRIL) 10 MG tablet Take 5 mg by mouth daily.       Allergies:  Allergies  Allergen Reactions  . Lisinopril Cough    Dizziness    Family History  Problem Relation Age of Onset  . Hypertension Mother   . Skin cancer Mother   . Pneumonia Mother   . Diabetes Father   . Heart attack Father 98  . Appendicitis Maternal Grandmother   . Diabetes Paternal Grandmother   . Heart disease Paternal Grandmother   . Asthma Paternal Grandfather   . Diabetes Sister   . Bladder Cancer Sister   . Hypertension Child     Social History:  reports that she quit smoking about 23 years ago. Her smoking use included Cigarettes. She has a 37 pack-year smoking history. She has never used smokeless tobacco. She reports that she does not drink alcohol or use illicit drugs.  ROS: Genitourinary, constitutional, skin, eye, otolaryngeal, hematologic/lymphatic, cardiovascular, pulmonary, endocrine, musculoskeletal, gastrointestinal, neurological and psychiatric system(s) were reviewed and pertinent findings if present are noted and are otherwise negative.  Genitourinary: nocturia.  ENT: sinus problems.  Hematologic/Lymphatic: a tendency to easily bruise.   Physical Exam:  Vital signs in last 24 hours: Temp:  [97.8 F (36.6 C)] 97.8 F (36.6 C) (04/05 1123) Pulse Rate:  [74] 74 (04/05 1123) Resp:  [16-52] 52 (04/05 1150) BP: (93-155)/(37-126) 93/37 mmHg (04/05 1150) SpO2:  [88 %-97 %] 95 % (04/05 1150) Weight:  [68.04 kg (150  lb)] 68.04 kg (150 lb) (04/05 1101) General:  Alert and oriented, No acute distress HEENT: Normocephalic, atraumatic Neck: No JVD or lymphadenopathy Cardiovascular: Regular rate and rhythm Lungs: Clear bilaterally Abdomen: Soft, nontender, nondistended, no abdominal masses Back: No CVA tenderness Extremities: No edema Neurologic: Grossly intact  Laboratory Data:  Results for orders placed or performed during the hospital encounter of 10/31/14 (from the past 24 hour(s))  Glucose, capillary     Status: Abnormal   Collection Time: 10/31/14 11:27 AM  Result Value Ref Range   Glucose-Capillary 128 (H) 70 - 99 mg/dL   No results found for this or any previous visit (from the past 240 hour(s)). Creatinine:  Recent Labs  10/26/14 1352  CREATININE 1.26*    Radiologic Imaging: No results found.  Impression/Assessment:  History of non-muscle invasive bladder cancer, status post initial resection in 2007.  She has an erythematous area on the right side of the bladder consistent with possible recurrence, especially with recent positive cytology  Plan:  We will perform cystoscopy under anesthesia, bilateral retrogrades, bladder biopsies.  Jorja Loa 10/31/2014, 11:59 AM  Lillette Boxer. Danicia Terhaar MD

## 2014-10-31 NOTE — Progress Notes (Signed)
Awake. Coke given to drink. Tolerated well.

## 2014-11-02 ENCOUNTER — Encounter (HOSPITAL_COMMUNITY): Payer: Self-pay | Admitting: Urology

## 2014-11-22 DIAGNOSIS — E785 Hyperlipidemia, unspecified: Secondary | ICD-10-CM | POA: Diagnosis not present

## 2014-11-22 DIAGNOSIS — I10 Essential (primary) hypertension: Secondary | ICD-10-CM | POA: Diagnosis not present

## 2014-11-22 DIAGNOSIS — E119 Type 2 diabetes mellitus without complications: Secondary | ICD-10-CM | POA: Diagnosis not present

## 2014-11-22 DIAGNOSIS — I482 Chronic atrial fibrillation: Secondary | ICD-10-CM | POA: Diagnosis not present

## 2014-11-28 ENCOUNTER — Ambulatory Visit (INDEPENDENT_AMBULATORY_CARE_PROVIDER_SITE_OTHER): Payer: Medicare Other | Admitting: Urology

## 2014-11-28 DIAGNOSIS — C672 Malignant neoplasm of lateral wall of bladder: Secondary | ICD-10-CM | POA: Diagnosis not present

## 2014-12-20 DIAGNOSIS — I482 Chronic atrial fibrillation: Secondary | ICD-10-CM | POA: Diagnosis not present

## 2014-12-20 DIAGNOSIS — E1165 Type 2 diabetes mellitus with hyperglycemia: Secondary | ICD-10-CM | POA: Diagnosis not present

## 2014-12-20 DIAGNOSIS — E782 Mixed hyperlipidemia: Secondary | ICD-10-CM | POA: Diagnosis not present

## 2015-01-04 ENCOUNTER — Ambulatory Visit (INDEPENDENT_AMBULATORY_CARE_PROVIDER_SITE_OTHER): Payer: Medicare Other | Admitting: *Deleted

## 2015-01-04 ENCOUNTER — Encounter: Payer: Self-pay | Admitting: Cardiovascular Disease

## 2015-01-04 DIAGNOSIS — I480 Paroxysmal atrial fibrillation: Secondary | ICD-10-CM

## 2015-01-04 NOTE — Progress Notes (Signed)
Remote pacemaker transmission.   

## 2015-01-08 LAB — CUP PACEART REMOTE DEVICE CHECK
Battery Impedance: 2814 Ohm
Battery Remaining Longevity: 18 mo
Battery Voltage: 2.71 V
Brady Statistic AP VP Percent: 0 %
Brady Statistic AP VS Percent: 99 %
Brady Statistic AS VP Percent: 0 %
Brady Statistic AS VS Percent: 0 %
Date Time Interrogation Session: 20160609133013
Lead Channel Impedance Value: 500 Ohm
Lead Channel Impedance Value: 511 Ohm
Lead Channel Pacing Threshold Amplitude: 0.5 V
Lead Channel Pacing Threshold Amplitude: 0.75 V
Lead Channel Pacing Threshold Pulse Width: 0.4 ms
Lead Channel Pacing Threshold Pulse Width: 0.4 ms
Lead Channel Sensing Intrinsic Amplitude: 11.2 mV
Lead Channel Setting Pacing Amplitude: 1.5 V
Lead Channel Setting Pacing Amplitude: 2 V
Lead Channel Setting Pacing Pulse Width: 0.4 ms
Lead Channel Setting Sensing Sensitivity: 5.6 mV

## 2015-01-16 ENCOUNTER — Encounter: Payer: Self-pay | Admitting: Cardiology

## 2015-01-17 DIAGNOSIS — E119 Type 2 diabetes mellitus without complications: Secondary | ICD-10-CM | POA: Diagnosis not present

## 2015-01-17 DIAGNOSIS — I482 Chronic atrial fibrillation: Secondary | ICD-10-CM | POA: Diagnosis not present

## 2015-02-21 DIAGNOSIS — E119 Type 2 diabetes mellitus without complications: Secondary | ICD-10-CM | POA: Diagnosis not present

## 2015-02-21 DIAGNOSIS — E785 Hyperlipidemia, unspecified: Secondary | ICD-10-CM | POA: Diagnosis not present

## 2015-02-21 DIAGNOSIS — I482 Chronic atrial fibrillation: Secondary | ICD-10-CM | POA: Diagnosis not present

## 2015-03-21 DIAGNOSIS — E782 Mixed hyperlipidemia: Secondary | ICD-10-CM | POA: Diagnosis not present

## 2015-03-21 DIAGNOSIS — I482 Chronic atrial fibrillation: Secondary | ICD-10-CM | POA: Diagnosis not present

## 2015-03-21 DIAGNOSIS — E119 Type 2 diabetes mellitus without complications: Secondary | ICD-10-CM | POA: Diagnosis not present

## 2015-04-09 ENCOUNTER — Ambulatory Visit (INDEPENDENT_AMBULATORY_CARE_PROVIDER_SITE_OTHER): Payer: Medicare Other | Admitting: *Deleted

## 2015-04-09 DIAGNOSIS — I480 Paroxysmal atrial fibrillation: Secondary | ICD-10-CM | POA: Diagnosis not present

## 2015-04-09 NOTE — Progress Notes (Signed)
Remote pacemaker transmission.   

## 2015-04-18 DIAGNOSIS — E119 Type 2 diabetes mellitus without complications: Secondary | ICD-10-CM | POA: Diagnosis not present

## 2015-04-18 DIAGNOSIS — E782 Mixed hyperlipidemia: Secondary | ICD-10-CM | POA: Diagnosis not present

## 2015-04-18 DIAGNOSIS — I482 Chronic atrial fibrillation: Secondary | ICD-10-CM | POA: Diagnosis not present

## 2015-04-18 DIAGNOSIS — Z23 Encounter for immunization: Secondary | ICD-10-CM | POA: Diagnosis not present

## 2015-04-18 DIAGNOSIS — I519 Heart disease, unspecified: Secondary | ICD-10-CM | POA: Diagnosis not present

## 2015-04-20 LAB — CUP PACEART REMOTE DEVICE CHECK
Battery Remaining Longevity: 16 mo
Battery Voltage: 2.7 V
Brady Statistic AP VP Percent: 0 %
Brady Statistic AS VP Percent: 0 %
Brady Statistic AS VS Percent: 0 %
Lead Channel Pacing Threshold Amplitude: 0.5 V
Lead Channel Pacing Threshold Amplitude: 0.75 V
Lead Channel Pacing Threshold Pulse Width: 0.4 ms
Lead Channel Sensing Intrinsic Amplitude: 11.2 mV
Lead Channel Setting Pacing Pulse Width: 0.4 ms
MDC IDC MSMT BATTERY IMPEDANCE: 3009 Ohm
MDC IDC MSMT LEADCHNL RA IMPEDANCE VALUE: 489 Ohm
MDC IDC MSMT LEADCHNL RV IMPEDANCE VALUE: 524 Ohm
MDC IDC MSMT LEADCHNL RV PACING THRESHOLD PULSEWIDTH: 0.4 ms
MDC IDC SESS DTM: 20160912141747
MDC IDC SET LEADCHNL RA PACING AMPLITUDE: 1.5 V
MDC IDC SET LEADCHNL RV PACING AMPLITUDE: 2 V
MDC IDC SET LEADCHNL RV SENSING SENSITIVITY: 5.6 mV
MDC IDC STAT BRADY AP VS PERCENT: 99 %

## 2015-04-23 DIAGNOSIS — I482 Chronic atrial fibrillation: Secondary | ICD-10-CM | POA: Diagnosis not present

## 2015-05-01 DIAGNOSIS — I482 Chronic atrial fibrillation: Secondary | ICD-10-CM | POA: Diagnosis not present

## 2015-05-02 ENCOUNTER — Encounter: Payer: Self-pay | Admitting: Cardiology

## 2015-05-09 DIAGNOSIS — E119 Type 2 diabetes mellitus without complications: Secondary | ICD-10-CM | POA: Diagnosis not present

## 2015-05-09 DIAGNOSIS — E782 Mixed hyperlipidemia: Secondary | ICD-10-CM | POA: Diagnosis not present

## 2015-05-09 DIAGNOSIS — I10 Essential (primary) hypertension: Secondary | ICD-10-CM | POA: Diagnosis not present

## 2015-05-09 DIAGNOSIS — Z7901 Long term (current) use of anticoagulants: Secondary | ICD-10-CM | POA: Diagnosis not present

## 2015-05-14 ENCOUNTER — Encounter: Payer: Self-pay | Admitting: Cardiovascular Disease

## 2015-05-16 DIAGNOSIS — I482 Chronic atrial fibrillation: Secondary | ICD-10-CM | POA: Diagnosis not present

## 2015-05-30 DIAGNOSIS — E119 Type 2 diabetes mellitus without complications: Secondary | ICD-10-CM | POA: Diagnosis not present

## 2015-05-30 DIAGNOSIS — E782 Mixed hyperlipidemia: Secondary | ICD-10-CM | POA: Diagnosis not present

## 2015-05-30 DIAGNOSIS — I482 Chronic atrial fibrillation: Secondary | ICD-10-CM | POA: Diagnosis not present

## 2015-06-21 DIAGNOSIS — Z8551 Personal history of malignant neoplasm of bladder: Secondary | ICD-10-CM | POA: Diagnosis not present

## 2015-06-21 DIAGNOSIS — E119 Type 2 diabetes mellitus without complications: Secondary | ICD-10-CM | POA: Diagnosis not present

## 2015-06-21 DIAGNOSIS — Z951 Presence of aortocoronary bypass graft: Secondary | ICD-10-CM | POA: Diagnosis not present

## 2015-06-21 DIAGNOSIS — Z79899 Other long term (current) drug therapy: Secondary | ICD-10-CM | POA: Diagnosis not present

## 2015-06-21 DIAGNOSIS — Z7901 Long term (current) use of anticoagulants: Secondary | ICD-10-CM | POA: Diagnosis not present

## 2015-06-21 DIAGNOSIS — R040 Epistaxis: Secondary | ICD-10-CM | POA: Diagnosis not present

## 2015-06-21 DIAGNOSIS — Z950 Presence of cardiac pacemaker: Secondary | ICD-10-CM | POA: Diagnosis not present

## 2015-06-21 DIAGNOSIS — Z794 Long term (current) use of insulin: Secondary | ICD-10-CM | POA: Diagnosis not present

## 2015-06-21 DIAGNOSIS — I10 Essential (primary) hypertension: Secondary | ICD-10-CM | POA: Diagnosis not present

## 2015-06-21 DIAGNOSIS — Z7982 Long term (current) use of aspirin: Secondary | ICD-10-CM | POA: Diagnosis not present

## 2015-06-25 DIAGNOSIS — R040 Epistaxis: Secondary | ICD-10-CM | POA: Diagnosis not present

## 2015-07-17 ENCOUNTER — Ambulatory Visit (INDEPENDENT_AMBULATORY_CARE_PROVIDER_SITE_OTHER): Payer: Medicare Other | Admitting: Cardiovascular Disease

## 2015-07-17 ENCOUNTER — Encounter: Payer: Self-pay | Admitting: Cardiovascular Disease

## 2015-07-17 VITALS — BP 101/60 | HR 80 | Resp 16 | Ht 62.5 in | Wt 151.3 lb

## 2015-07-17 DIAGNOSIS — I25118 Atherosclerotic heart disease of native coronary artery with other forms of angina pectoris: Secondary | ICD-10-CM | POA: Diagnosis not present

## 2015-07-17 DIAGNOSIS — I480 Paroxysmal atrial fibrillation: Secondary | ICD-10-CM

## 2015-07-17 DIAGNOSIS — Z7901 Long term (current) use of anticoagulants: Secondary | ICD-10-CM

## 2015-07-17 DIAGNOSIS — Z950 Presence of cardiac pacemaker: Secondary | ICD-10-CM | POA: Diagnosis not present

## 2015-07-17 DIAGNOSIS — I739 Peripheral vascular disease, unspecified: Secondary | ICD-10-CM

## 2015-07-17 DIAGNOSIS — E785 Hyperlipidemia, unspecified: Secondary | ICD-10-CM

## 2015-07-17 DIAGNOSIS — I447 Left bundle-branch block, unspecified: Secondary | ICD-10-CM

## 2015-07-17 DIAGNOSIS — E1151 Type 2 diabetes mellitus with diabetic peripheral angiopathy without gangrene: Secondary | ICD-10-CM

## 2015-07-17 NOTE — Patient Instructions (Signed)
Remote monitoring is used to monitor your Pacemaker from home. This monitoring reduces the number of office visits required to check your device to one time per year. It allows [place] to monitor the functioning of your device to ensure it is working properly. You are scheduled for a device check from home on October 16, 2015. You may send your transmission at any time that day. If you have a wireless device, the transmission will be sent automatically. After your physician reviews your transmission, you will receive a postcard with your next transmission date.  Dr. Sallyanne Kuster recommends that you schedule a follow-up appointment in: [place] ([place]).

## 2015-07-17 NOTE — Progress Notes (Signed)
Patient ID: Megan Haley, female   DOB: 07-27-1932, 81 y.o.   MRN: [id number]    Cardiology Office Note    Date:  07/19/2015   ID:  Megan Haley, DOB 08-14-1931, MRN [id number]  PCP:  Wende Neighbors, MD  Cardiologist:   Sanda Klein, MD   Chief Complaint  Patient presents with  . Follow-up    no chest pain, occassional shortness of breath, no edema, no pain or cramping in legs, no lightheaded or dizziness     History of Present Illness:  Megan Haley is a 81 y.o. female returning in follow-up for Pacemaker check, atrial fibrillation, coronary artery disease   Around the time of Thanksgiving she had 3-4 days of persistent epistaxis and eventually required nasal packing. Her anticoagulation was not supratherapeutic at the time. Her care was provided in [facility]. She has not had recurrent epistaxis since. Has not had other bleeding problems or any neurological events. She denies exertional dyspnea, angina, lower extremity edema, claudication, palpitations.  Pacemaker interrogation shows normal device function. Battery longevity is estimated at around 14 months. She has virtually 100% atrial pacing but only 0.5% ventricular pacing. Heart rate histogram is slightly blunted but appropriate for her sedentary lifestyle. No episodes of atrial fibrillation have been recorded. A few episodes of high ventricular rate are recorded but the longest one is only 9 seconds. Most of them appear to be atrial tachycardia with one-to-one conduction but at least one of them may represent nonsustained ventricular tachycardia.  She had a myocardial infarction in 1993 and underwent multivessel bypass surgery in [place]. She had three-vessel bypass surgery (repeat cardiac catheterization in 2007 showed 80% proximal LAD, totally occluded circumflex, totally occluded RCA, patent LIMA to LAD, patent SVG to OM, total occlusion of the SVG to RCA, LVEF 40% with inferior akinesia sent anteroapical  hypokinesis). A nuclear stress test performed in June 2014 showed inferior and anteroapical scar with minimal peri-infarct ischemia. EF by scintigraphy was 51%. She received a dual-chamber Medtronic Adapta permanent pacemaker in 2009 for sinus node dysfunction with symptomatic bradycardia. She has a chronic LBBB. She is not pacemaker dependent, but has pacing in the atrium virtually 100% of the time. She does not require ventricular pacing.  She also has peripheral arterial disease of the lower extremities with a high-grade mid to distal left SFA stenosis estimated to be 70-99% in severity by her last duplex ultrasound in January of this year. She has mild nonobstructive carotid atherosclerosis (she has a high-grade stenosis in the left external carotid).   Past Medical History  Diagnosis Date  . Pacemaker   . CAD (coronary artery disease)   . PAD (peripheral artery disease) ([place])   . IDDM (insulin dependent diabetes mellitus) ([place])   . Former smoker   . Hyperlipidemia   . PAF (paroxysmal atrial fibrillation) ([place])   . Bladder cancer [facility]) 2007    surgery & chemo  . MI (myocardial infarction) ([place]) 1993    CABG in [place]  . Deafness in right ear   . HOH (hard of hearing)     Past Surgical History  Procedure Laterality Date  . Yag laser application Right [id number]    Procedure: YAG LASER APPLICATION;  Surgeon: Elta Guadeloupe T. Gershon Crane, MD;  Location: [facility];  Service: Ophthalmology;  Laterality: Right;  . Bladder repair  1990  . Abdominal hysterectomy  1900  . Bladder surgery  2007    r/t bladder cancer  . Coronary artery bypass graft  1993  in [place]  - LIMA to LAD, VG to PDA, VG and marginal   . Transthoracic echocardiogram  01/2013    EF 40-45%, mild LVH; ventricular spetum with abnormal function & dysynergy; calcified MV annulus, mild MR; PA peak pressure 14mmHg  . Nm myocar perf wall motion  12/2012    lexiscan - low riskwith medium-large size, severe in intensity fixed defect consistent  with previous infarction & small size, mild intensity fixed defect in distal anterior to anteroapical region consistent with previous infarction  . Cardiac catheterization  04/03/2006    [facility] - L main diffusely disease, LAD w/50-60% diffuse disease and 80% lesion in prox/mid region; LCfx totally occluded proximally; RCA with 70% prox lesion after which RCA was totally occluded; patent LIMA-LAD; VG to RCA occluded; EF 40% (Dr. Marisa Cyphers)  . Upper extremity arterial doppler  01/2013    non-compressible calcified bilateral brachial pressure, bilat digits WNL  . Carotid doppler  07/2012    mild amount of fibrous plaque in bilateral bulb/prox ICA; LECA with elevated velocities  . Lower extremity arterial doppler  07/2012    moderate to severe arterial insufficiency, diffuse plaque in R SFA & less than 50% diameter reduction; L SFA with diffuse plaque & 70-99% diameter reduction  . Pacemaker insertion  07/30/2007    Medtronic Adapta ADDR01 - Dr. Gerrie Nordmann - symptomatic bradycardia  . Carpal tunnel release    . Cataract extraction Bilateral   . Cystoscopy w/ retrogrades Bilateral 10/31/2014    Procedure: CYSTOSCOPY WITH BILATERAL RETROGRADE PYELOGRAM AND RENAL WASHINGS FOR CYTOLOGY WITH BLADDER BIOPSY;  Surgeon: Franchot Gallo, MD;  Location: [facility];  Service: Urology;  Laterality: Bilateral;    Current Outpatient Prescriptions  Medication Sig Dispense Refill  . aspirin EC 81 MG tablet Take 81 mg by mouth at bedtime.     . Biotin 5000 MCG CAPS Take 1 capsule by mouth daily.    . Cholecalciferol (VITAMIN D-3) 1000 UNITS CAPS Take 2,000 Units by mouth every morning.     . Choline Fenofibrate (TRILIPIX) 135 MG capsule Take 135 mg by mouth every morning.    . ferrous sulfate 325 (65 FE) MG tablet Take 325 mg by mouth daily with breakfast.    . LANTUS 100 UNIT/ML injection Inject 37 Units into the skin at bedtime.     Marland Kitchen lisinopril (PRINIVIL,ZESTRIL) 10 MG tablet Take 5 mg by mouth daily.      . metoprolol succinate (TOPROL-XL) 25 MG 24 hr tablet Take 25 mg by mouth every morning.     . sertraline (ZOLOFT) 50 MG tablet Take 50 mg by mouth at bedtime.     . simvastatin (ZOCOR) 20 MG tablet Take 20 mg by mouth at bedtime.     Marland Kitchen warfarin (COUMADIN) 2 MG tablet Take 1-2 mg by mouth See admin instructions. Patient takes 2mg  DAILY EXCEPT Monday AND Friday 1 mg     No current facility-administered medications for this visit.    Allergies:   Lisinopril   Social History   Social History  . Marital Status: Single    Spouse Name: N/A  . Number of Children: 3  . Years of Education: 65   Social History Main Topics  . Smoking status: Former Smoker -- 1.00 packs/day for 37 years    Types: Cigarettes    Quit date: 11/28/1990  . Smokeless tobacco: Never Used  . Alcohol Use: No  . Drug Use: No  . Sexual Activity: Not Asked  Other Topics Concern  . None   Social History Narrative     Family History:  The patient's family history includes Appendicitis in her maternal grandmother; Asthma in her paternal grandfather; Bladder Cancer in her sister; Diabetes in her father, paternal grandmother, and sister; Heart attack (age of onset: 31) in her father; Heart disease in her paternal grandmother; Hypertension in her child and mother; Pneumonia in her mother; Skin cancer in her mother.   ROS:   Please see the history of present illness.    Review of Systems  All other systems reviewed and are negative.    PHYSICAL EXAM:   VS:  BP 101/60 mmHg  Pulse 80  Resp 16  Ht 5' 2.5" (1.588 m)  Wt 151 lb 5 oz (68.635 kg)  BMI 27.22 kg/m2   GEN: Well nourished, well developed, in no acute distress HEENT: normal Neck: no JVD, carotid bruits, or masses Cardiac: RRR; normal first and paradoxically split second heart sounds, no rubs or gallops, 2/6 early peaking systolic ejection murmur in the aortic focus , rubs, or gallops,no edema; healthy left subclavian pacemaker site  2+ right femoral,  1+ right posterior tibial and dorsalis pedis pulses; 2+ left femoral, very weak left posterior tibial and dorsalis pedis pulses; no subclavian or femoral bruits  Respiratory:  clear to auscultation bilaterally, normal work of breathing GI: soft, nontender, nondistended, + BS MS: no deformity or atrophy Skin: warm and dry, no rash Neuro:  Alert and Oriented x 3, Strength and sensation are intact Psych: euthymic mood, full affect  Wt Readings from Last 3 Encounters:  07/17/15 151 lb 5 oz (68.635 kg)  10/31/14 150 lb (68.04 kg)  08/27/14 149 lb (67.586 kg)      Studies/Labs Reviewed:   EKG:  EKG is ordered today.  The ekg ordered today demonstrates atrial paced, ventricular sensed rhythm, chronic left bundle branch block. QRS 142 ms, QTC 477 ms  Recent Labs: 10/26/2014: BUN 39*; Creatinine, Ser 1.26*; Hemoglobin 12.9; Platelets 240; Potassium 5.0; Sodium 136     ASSESSMENT:    1. Paroxysmal atrial fibrillation (HCC)   2. Pacemaker - dual chamber Medtronic adapta 2009   3. Long term (current) use of anticoagulants   4. Coronary artery disease involving native coronary artery of native heart with other form of angina pectoris ([place])   5. PAD (peripheral artery disease) ([place])   6. LBBB (left bundle branch block)   7. DM (diabetes mellitus) with peripheral vascular complication ([place])   8. Hyperlipidemia      PLAN:  In order of problems listed above:  1. No recent episodes of atrial fibrillation recorded by her pacemaker. Embolic risk is high. Chadsvasc score is 11 (age 75, gender, vascular disease, diabetes mellitus).  2. Normally functioning dual-chamber permanent pacemaker. Continue remote downloads every 3 months in office checks yearly. Anticipate generator change out in 2018. 3. Discussed the fact that occasional nuisance bleeding such as epistaxis, gingival bleeding, easy bruising will unfortunately be present as long as we use anticoagulants, which remain indicated. We did  discuss switching from warfarin to a newer agent such as Eliquis (she has mildly abnormal renal function), but the bleeding risk will only be slightly lower. She is concerned about the cost of this medication. I recommended that she discuss this with her primary care provider as well. Recommended that she use a humidifier in her bedroom to reduce the likelihood of nosebleeds. 4. No symptoms of active coronary insufficiency. 5. No symptoms of claudication  6. Despite the presence of left bundle branch block, no need for ventricular pacing with MVP on 7. Will obtain more recent lab results from her primary care physician for both glycemic control and most recent lipid profile   Medication Adjustments/Labs and Tests Ordered: Current medicines are reviewed at length with the patient today.  Concerns regarding medicines are outlined above.  Medication changes, Labs and Tests ordered today are listed below. Patient Instructions  Remote monitoring is used to monitor your Pacemaker from home. This monitoring reduces the number of office visits required to check your device to one time per year. It allows [place] to monitor the functioning of your device to ensure it is working properly. You are scheduled for a device check from home on October 16, 2015. You may send your transmission at any time that day. If you have a wireless device, the transmission will be sent automatically. After your physician reviews your transmission, you will receive a postcard with your next transmission date.  Dr. Sallyanne Kuster recommends that you schedule a follow-up appointment in: [place] ([place]).      Mikael Spray, MD  07/19/2015 8:43 AM    [facility] [place], [place], [place]  [address] Phone: [phone]; Fax: [phone]

## 2015-07-25 DIAGNOSIS — E782 Mixed hyperlipidemia: Secondary | ICD-10-CM | POA: Diagnosis not present

## 2015-07-25 DIAGNOSIS — I482 Chronic atrial fibrillation: Secondary | ICD-10-CM | POA: Diagnosis not present

## 2015-07-25 DIAGNOSIS — E119 Type 2 diabetes mellitus without complications: Secondary | ICD-10-CM | POA: Diagnosis not present

## 2015-08-22 DIAGNOSIS — E782 Mixed hyperlipidemia: Secondary | ICD-10-CM | POA: Diagnosis not present

## 2015-08-22 DIAGNOSIS — E119 Type 2 diabetes mellitus without complications: Secondary | ICD-10-CM | POA: Diagnosis not present

## 2015-08-22 DIAGNOSIS — I482 Chronic atrial fibrillation: Secondary | ICD-10-CM | POA: Diagnosis not present

## 2015-09-12 DIAGNOSIS — E119 Type 2 diabetes mellitus without complications: Secondary | ICD-10-CM | POA: Diagnosis not present

## 2015-09-12 DIAGNOSIS — I482 Chronic atrial fibrillation: Secondary | ICD-10-CM | POA: Diagnosis not present

## 2015-09-12 DIAGNOSIS — E782 Mixed hyperlipidemia: Secondary | ICD-10-CM | POA: Diagnosis not present

## 2015-10-11 ENCOUNTER — Ambulatory Visit (INDEPENDENT_AMBULATORY_CARE_PROVIDER_SITE_OTHER): Payer: Medicare Other | Admitting: Otolaryngology

## 2015-10-11 DIAGNOSIS — H6121 Impacted cerumen, right ear: Secondary | ICD-10-CM | POA: Diagnosis not present

## 2015-10-11 DIAGNOSIS — H903 Sensorineural hearing loss, bilateral: Secondary | ICD-10-CM

## 2015-10-16 ENCOUNTER — Ambulatory Visit (INDEPENDENT_AMBULATORY_CARE_PROVIDER_SITE_OTHER): Payer: Medicare Other | Admitting: Urology

## 2015-10-16 DIAGNOSIS — C670 Malignant neoplasm of trigone of bladder: Secondary | ICD-10-CM

## 2015-10-16 DIAGNOSIS — C672 Malignant neoplasm of lateral wall of bladder: Secondary | ICD-10-CM | POA: Diagnosis not present

## 2015-10-18 IMAGING — DX DG CHEST 2V
2 series · 2 of 2 positions shown · non-contrast
Comparison: Single view of the chest 06/19/2010 and 07/31/2007. CT
lumbar spine 03/05/2014.

CLINICAL DATA: Congestion, wheezing and shortness of breath since
07/21/2014.

EXAM:
CHEST  2 VIEW

[chest pa]
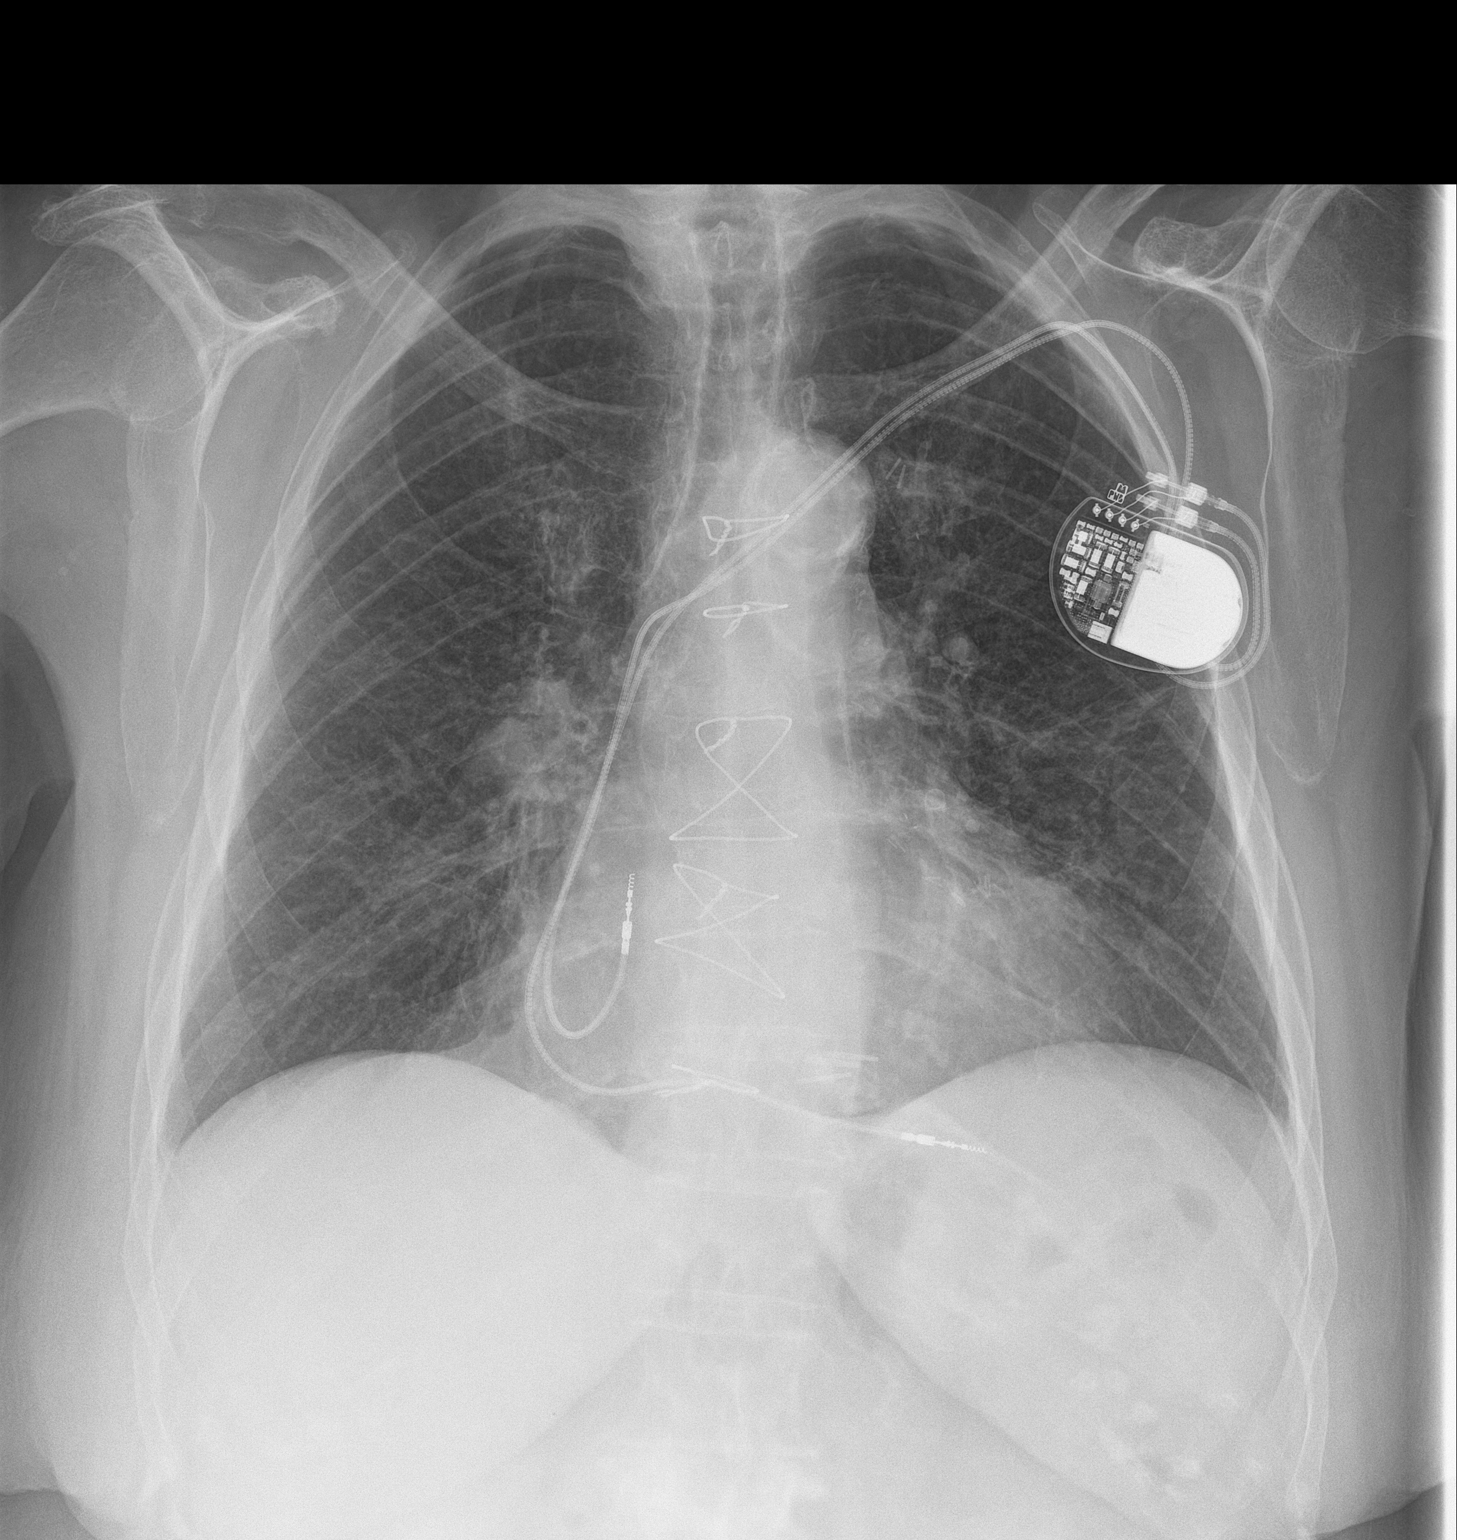

[chest lat]
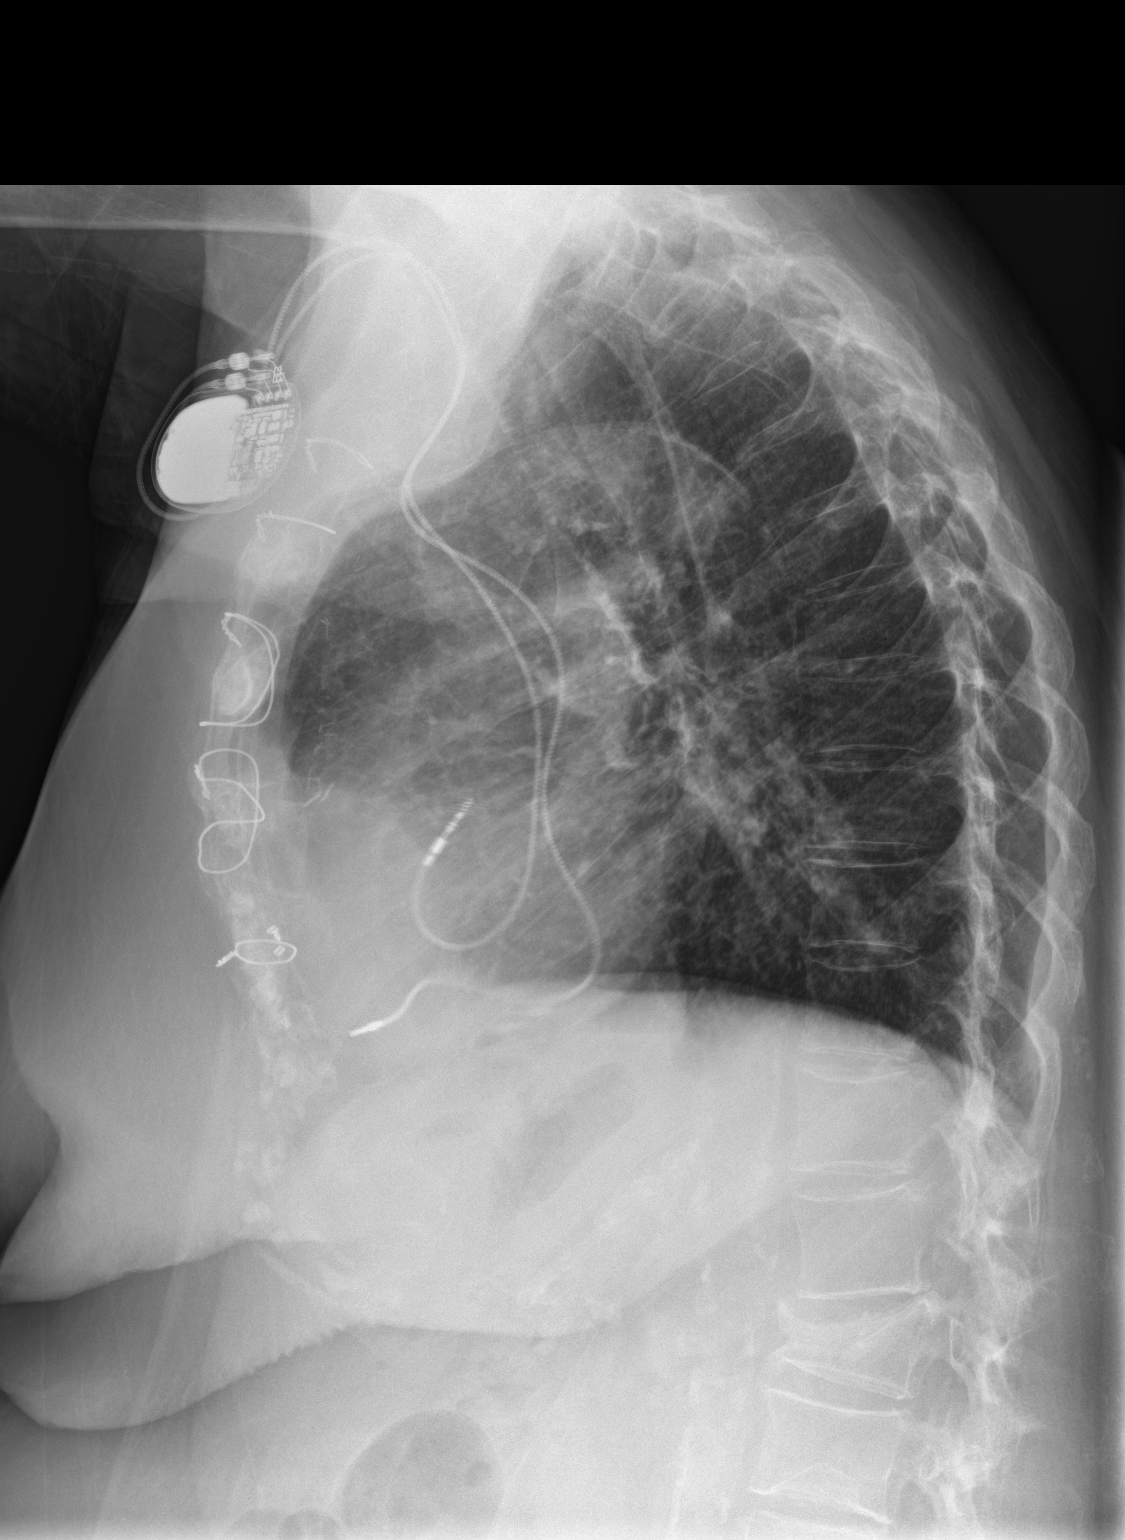

[2 of 2 positions shown; findings below may reference images not displayed]

FINDINGS: The patient is status post CABG with a pacing device in place. The
lungs are clear. Heart size is normal. No pneumothorax or pleural
effusion.

Remote L2 compression fracture is identified as seen on the prior
lumbar spine CT scan. There has been increase in vertebral body
height loss since the prior study.
IMPRESSION: No acute disease.

## 2015-10-25 ENCOUNTER — Telehealth: Payer: Self-pay | Admitting: Cardiovascular Disease

## 2015-10-25 NOTE — Telephone Encounter (Signed)
[place]  Pt stated- she thinks she is due for remote check- but has not received notification. Please call back and discuss.

## 2015-10-25 NOTE — Telephone Encounter (Signed)
Returned patient's call.  Advised that she can send a transmission today or tomorrow from her [employer] monitor.  Patient verbalizes understanding and states she will send a transmission tomorrow with her granddaughter's help.  Advised that she will receive a letter in the mail with her next remote date after tomorrow's transmission is processed.  She is appreciative of call and denies additional questions or concerns at this time.

## 2015-10-26 ENCOUNTER — Ambulatory Visit (INDEPENDENT_AMBULATORY_CARE_PROVIDER_SITE_OTHER): Payer: Medicare Other | Admitting: *Deleted

## 2015-10-26 DIAGNOSIS — I480 Paroxysmal atrial fibrillation: Secondary | ICD-10-CM | POA: Diagnosis not present

## 2015-10-26 DIAGNOSIS — Z950 Presence of cardiac pacemaker: Secondary | ICD-10-CM

## 2015-10-26 NOTE — Progress Notes (Signed)
Remote pacemaker transmission.   

## 2015-11-07 DIAGNOSIS — I519 Heart disease, unspecified: Secondary | ICD-10-CM | POA: Diagnosis not present

## 2015-11-07 DIAGNOSIS — I480 Paroxysmal atrial fibrillation: Secondary | ICD-10-CM | POA: Diagnosis not present

## 2015-11-07 DIAGNOSIS — E119 Type 2 diabetes mellitus without complications: Secondary | ICD-10-CM | POA: Diagnosis not present

## 2015-11-07 DIAGNOSIS — I482 Chronic atrial fibrillation: Secondary | ICD-10-CM | POA: Diagnosis not present

## 2015-11-07 DIAGNOSIS — E782 Mixed hyperlipidemia: Secondary | ICD-10-CM | POA: Diagnosis not present

## 2015-11-07 DIAGNOSIS — E559 Vitamin D deficiency, unspecified: Secondary | ICD-10-CM | POA: Diagnosis not present

## 2015-11-23 LAB — CUP PACEART REMOTE DEVICE CHECK
Battery Impedance: 3754 Ohm
Battery Remaining Longevity: 11 mo
Brady Statistic AS VP Percent: 0 %
Date Time Interrogation Session: 20170331134611
Implantable Lead Implant Date: 20090102
Implantable Lead Location: 753859
Implantable Lead Location: 753860
Implantable Lead Model: 5076
Lead Channel Impedance Value: 472 Ohm
Lead Channel Pacing Threshold Amplitude: 1 V
Lead Channel Pacing Threshold Pulse Width: 0.4 ms
Lead Channel Setting Pacing Amplitude: 1.5 V
Lead Channel Setting Pacing Pulse Width: 0.4 ms
Lead Channel Setting Sensing Sensitivity: 5.6 mV
MDC IDC LEAD IMPLANT DT: 20090102
MDC IDC MSMT BATTERY VOLTAGE: 2.69 V
MDC IDC MSMT LEADCHNL RA IMPEDANCE VALUE: 493 Ohm
MDC IDC MSMT LEADCHNL RA PACING THRESHOLD AMPLITUDE: 0.625 V
MDC IDC MSMT LEADCHNL RA PACING THRESHOLD PULSEWIDTH: 0.4 ms
MDC IDC MSMT LEADCHNL RV SENSING INTR AMPL: 11.2 mV
MDC IDC SET LEADCHNL RV PACING AMPLITUDE: 2 V
MDC IDC STAT BRADY AP VP PERCENT: 1 %
MDC IDC STAT BRADY AP VS PERCENT: 99 %
MDC IDC STAT BRADY AS VS PERCENT: 0 %

## 2015-11-27 ENCOUNTER — Encounter: Payer: Self-pay | Admitting: Cardiology

## 2015-12-05 DIAGNOSIS — E119 Type 2 diabetes mellitus without complications: Secondary | ICD-10-CM | POA: Diagnosis not present

## 2015-12-05 DIAGNOSIS — E782 Mixed hyperlipidemia: Secondary | ICD-10-CM | POA: Diagnosis not present

## 2015-12-05 DIAGNOSIS — I482 Chronic atrial fibrillation: Secondary | ICD-10-CM | POA: Diagnosis not present

## 2016-01-09 DIAGNOSIS — E161 Other hypoglycemia: Secondary | ICD-10-CM | POA: Diagnosis not present

## 2016-01-10 ENCOUNTER — Emergency Department (HOSPITAL_COMMUNITY): Payer: Medicare Other

## 2016-01-10 ENCOUNTER — Encounter (HOSPITAL_COMMUNITY): Payer: Self-pay | Admitting: Emergency Medicine

## 2016-01-10 ENCOUNTER — Emergency Department (HOSPITAL_COMMUNITY)
Admission: EM | Admit: 2016-01-10 | Discharge: 2016-01-11 | Disposition: A | Discharge status: Home / Self Care | Payer: Medicare Other | Attending: Emergency Medicine | Admitting: Emergency Medicine

## 2016-01-10 DIAGNOSIS — Z7901 Long term (current) use of anticoagulants: Secondary | ICD-10-CM | POA: Diagnosis not present

## 2016-01-10 DIAGNOSIS — E785 Hyperlipidemia, unspecified: Secondary | ICD-10-CM | POA: Diagnosis not present

## 2016-01-10 DIAGNOSIS — I252 Old myocardial infarction: Secondary | ICD-10-CM | POA: Insufficient documentation

## 2016-01-10 DIAGNOSIS — I480 Paroxysmal atrial fibrillation: Secondary | ICD-10-CM | POA: Diagnosis not present

## 2016-01-10 DIAGNOSIS — I2510 Atherosclerotic heart disease of native coronary artery without angina pectoris: Secondary | ICD-10-CM | POA: Diagnosis not present

## 2016-01-10 DIAGNOSIS — Z79899 Other long term (current) drug therapy: Secondary | ICD-10-CM | POA: Diagnosis not present

## 2016-01-10 DIAGNOSIS — I482 Chronic atrial fibrillation: Secondary | ICD-10-CM | POA: Diagnosis not present

## 2016-01-10 DIAGNOSIS — M62838 Other muscle spasm: Secondary | ICD-10-CM | POA: Diagnosis not present

## 2016-01-10 DIAGNOSIS — Z87891 Personal history of nicotine dependence: Secondary | ICD-10-CM | POA: Diagnosis not present

## 2016-01-10 DIAGNOSIS — E1151 Type 2 diabetes mellitus with diabetic peripheral angiopathy without gangrene: Secondary | ICD-10-CM | POA: Insufficient documentation

## 2016-01-10 DIAGNOSIS — E119 Type 2 diabetes mellitus without complications: Secondary | ICD-10-CM | POA: Diagnosis not present

## 2016-01-10 DIAGNOSIS — J811 Chronic pulmonary edema: Secondary | ICD-10-CM | POA: Diagnosis not present

## 2016-01-10 DIAGNOSIS — M25511 Pain in right shoulder: Secondary | ICD-10-CM | POA: Diagnosis present

## 2016-01-10 DIAGNOSIS — Z7982 Long term (current) use of aspirin: Secondary | ICD-10-CM | POA: Diagnosis not present

## 2016-01-10 DIAGNOSIS — M542 Cervicalgia: Secondary | ICD-10-CM | POA: Diagnosis not present

## 2016-01-10 LAB — PROTIME-INR
INR: 3.44 — ABNORMAL HIGH (ref 0.00–1.49)
Prothrombin Time: 34 seconds — ABNORMAL HIGH (ref 11.6–15.2)

## 2016-01-10 MED ORDER — TRAMADOL HCL 50 MG PO TABS
50.0000 mg | ORAL_TABLET | Freq: Once | ORAL | Status: AC
  Administered 2016-01-10: 50 mg via ORAL
  Filled 2016-01-10: qty 1

## 2016-01-10 MED ORDER — HYDROCODONE-ACETAMINOPHEN 5-325 MG PO TABS: ORAL_TABLET | ORAL | Status: AC

## 2016-01-10 NOTE — ED Provider Notes (Signed)
CSN: [id number]     Arrival date & time 01/10/16  2042 History   First MD Initiated Contact with Patient 01/10/16 2159     Chief Complaint  Patient presents with  . Shoulder Pain      HPI Pt was seen at 2205. Per pt, c/o gradual onset and persistence of constant right sided neck/shoulder "pain" that she noticed when she woke up this morning. Pt states she "thinks I slept wrong on it." Pain worsens with palpation of the area and body position changes. Pt took a tylenol "around dinnertime" without improvement. Denies injury, no headache, no CP/SOB, no abd pain, no N/V/D, no fevers, no rash, no focal motor weakness, no tingling/numbness in extremities.    Past Medical History  Diagnosis Date  . Pacemaker   . CAD (coronary artery disease)   . PAD (peripheral artery disease) ([place])   . IDDM (insulin dependent diabetes mellitus) ([place])   . Former smoker   . Hyperlipidemia   . PAF (paroxysmal atrial fibrillation) ([place])   . Bladder cancer [facility]) 2007    surgery & chemo  . MI (myocardial infarction) ([place]) 1993    CABG in [place]  . Deafness in right ear   . HOH (hard of hearing)    Past Surgical History  Procedure Laterality Date  . Yag laser application Right [id number]    Procedure: YAG LASER APPLICATION;  Surgeon: Elta Guadeloupe T. Gershon Crane, MD;  Location: [facility];  Service: Ophthalmology;  Laterality: Right;  . Bladder repair  1990  . Abdominal hysterectomy  1900  . Bladder surgery  2007    r/t bladder cancer  . [place]ary [place]  - LIMA to LAD, VG to PDA, VG and marginal   . Transthoracic echocardiogram  01/2013    EF 40-45%, mild LVH; ventricular spetum with abnormal function & dysynergy; calcified MV annulus, mild MR; PA peak pressure 34mmHg  . Nm myocar perf wall motion  12/2012    lexiscan - low riskwith medium-large size, severe in intensity fixed defect consistent with previous infarction & small size, mild intensity fixed defect in distal anterior to anteroapical  region consistent with previous infarction  . Cardiac catheterization  04/03/2006    [facility] - L main diffusely disease, LAD w/50-60% diffuse disease and 80% lesion in prox/mid region; LCfx totally occluded proximally; RCA with 70% prox lesion after which RCA was totally occluded; patent LIMA-LAD; VG to RCA occluded; EF 40% (Dr. Marisa Cyphers)  . Upper extremity arterial doppler  01/2013    non-compressible calcified bilateral brachial pressure, bilat digits WNL  . Carotid doppler  07/2012    mild amount of fibrous plaque in bilateral bulb/prox ICA; LECA with elevated velocities  . Lower extremity arterial doppler  07/2012    moderate to severe arterial insufficiency, diffuse plaque in R SFA & less than 50% diameter reduction; L SFA with diffuse plaque & 70-99% diameter reduction  . Pacemaker insertion  07/30/2007    [employer] Adapta ADDR01 - Dr. Gerrie Nordmann - symptomatic bradycardia  . Carpal tunnel release    . Cataract extraction Bilateral   . Cystoscopy w/ retrogrades Bilateral 10/31/2014    Procedure: CYSTOSCOPY WITH BILATERAL RETROGRADE PYELOGRAM AND RENAL WASHINGS FOR CYTOLOGY WITH BLADDER BIOPSY;  Surgeon: Franchot Gallo, MD;  Location: [facility];  Service: Urology;  Laterality: Bilateral;   Family History  Problem Relation Age of Onset  . Hypertension Mother   . Skin cancer Mother   .  Pneumonia Mother   . Diabetes Father   . Heart attack Father 21  . Appendicitis Maternal Grandmother   . Diabetes Paternal Grandmother   . Heart disease Paternal Grandmother   . Asthma Paternal Grandfather   . Diabetes Sister   . Bladder Cancer Sister   . Hypertension Child    Social History  Substance Use Topics  . Smoking status: Former Smoker -- 1.00 packs/day for 37 years    Types: Cigarettes    Quit date: 11/28/1990  . Smokeless tobacco: Never Used  . Alcohol Use: No   OB History    Gravida Para Term Preterm AB TAB SAB Ectopic Multiple Living   3 3  3      3      Review of  Systems ROS: Statement: All systems negative except as marked or noted in the HPI; Constitutional: Negative for fever and chills. ; ; Eyes: Negative for eye pain, redness and discharge. ; ; ENMT: Negative for ear pain, hoarseness, nasal congestion, sinus pressure and sore throat. ; ; Cardiovascular: Negative for chest pain, palpitations, diaphoresis, dyspnea and peripheral edema. ; ; Respiratory: Negative for cough, wheezing and stridor. ; ; Gastrointestinal: Negative for nausea, vomiting, diarrhea, abdominal pain, blood in stool, hematemesis, jaundice and rectal bleeding. . ; ; Genitourinary: Negative for dysuria, flank pain and hematuria. ; ; Musculoskeletal: +right neck pain. Negative for back pain. Negative for swelling and trauma.; ; Skin: Negative for pruritus, rash, abrasions, blisters, bruising and skin lesion.; ; Neuro: Negative for headache, lightheadedness and neck stiffness. Negative for weakness, altered level of consciousness, altered mental status, extremity weakness, paresthesias, involuntary movement, seizure and syncope.      Allergies  Lisinopril  Home Medications   Prior to Admission medications   Medication Sig Start Date End Date Taking? Authorizing Provider  aspirin EC 81 MG tablet Take 81 mg by mouth at bedtime.     Historical Provider, MD  Biotin 5000 MCG CAPS Take 1 capsule by mouth daily.    Historical Provider, MD  Cholecalciferol (VITAMIN D-3) 1000 UNITS CAPS Take 2,000 Units by mouth every morning.     Historical Provider, MD  Choline Fenofibrate (TRILIPIX) 135 MG capsule Take 135 mg by mouth every morning.    Historical Provider, MD  ferrous sulfate 325 (65 FE) MG tablet Take 325 mg by mouth daily with breakfast.    Historical Provider, MD  LANTUS 100 UNIT/ML injection Inject 37 Units into the skin at bedtime.  05/12/13   Historical Provider, MD  lisinopril (PRINIVIL,ZESTRIL) 10 MG tablet Take 5 mg by mouth daily.  11/24/13   Historical Provider, MD  metoprolol  succinate (TOPROL-XL) 25 MG 24 hr tablet Take 25 mg by mouth every morning.  05/08/13   Historical Provider, MD  sertraline (ZOLOFT) 50 MG tablet Take 50 mg by mouth at bedtime.  04/12/13   Historical Provider, MD  simvastatin (ZOCOR) 20 MG tablet Take 20 mg by mouth at bedtime.  06/03/13   Historical Provider, MD  warfarin (COUMADIN) 2 MG tablet Take 1-2 mg by mouth See admin instructions. Patient takes 2mg  DAILY EXCEPT Monday AND Friday 1 mg 05/27/13   Historical Provider, MD   BP 145/45 mmHg  Pulse 74  Temp(Src) 98.1 F (36.7 C) (Oral)  Resp 18  Ht 5' 2.5" (1.588 m)  Wt 149 lb (67.586 kg)  BMI 26.80 kg/m2  SpO2 94% Physical Exam  2210: Physical examination:  Nursing notes reviewed; Vital signs and O2 SAT reviewed;  Constitutional: Well  developed, Well nourished, Well hydrated, In no acute distress; Head:  Normocephalic, atraumatic; Eyes: EOMI, PERRL, No scleral icterus; ENMT: Mouth and pharynx normal, Mucous membranes moist; Neck: Supple, Full range of motion, No lymphadenopathy; Cardiovascular: Regular rate and rhythm, No gallop; Respiratory: Breath sounds clear & equal bilaterally, No wheezes.  Speaking full sentences with ease, Normal respiratory effort/excursion; Chest: Nontender, Movement normal; Abdomen: Soft, Nontender, Nondistended, Normal bowel sounds; Genitourinary: No CVA tenderness; Spine:  No midline CS, TS, LS tenderness. +TTP right hypertonic trapezius muscle. No rash.;; Extremities: Pulses normal, NT right shoulder/elbow/wrist/hand, muscles compartments soft, strong radial pulse. No rash. No ecchymosis, no deformity. No edema, Right shoulder w/FROM.  NT to palp entire joint, AC joint, clavicle NT, scapula NT, proximal humerus NT, biceps tendon NT over bicipital groove.  Motor strength at shoulder normal.  Sensation intact over deltoid region, distal NMS intact with right hand having intact and equal sensation and strength in the distribution of the median, radial, and ulnar nerve  function compared to opposite side.  Strong radial pulse.  +FROM right elbow with intact motor strength biceps and triceps muscles to resistance..; Neuro: AA&Ox3, Major CN grossly intact. No facial droop. Speech clear. Grips equal. Strength 5/5 equal bilat UE's and LE's. No gross focal motor or sensory deficits in extremities.; Skin: Color normal, Warm, Dry.   ED Course  Procedures (including critical care time) Labs Review  Imaging Review  I have personally reviewed and evaluated these images and lab results as part of my medical decision-making.   EKG Interpretation None      MDM  MDM Reviewed: previous chart, nursing note and vitals Reviewed previous: labs Interpretation: labs, x-ray and CT scan      Results for orders placed or performed during the hospital encounter of 01/10/16  Protime-INR  Result Value Ref Range   Prothrombin Time 34.0 (H) 11.6 - 15.2 seconds   INR 3.44 (H) 0.00 - 1.49   Dg Chest 2 View 01/10/2016  CLINICAL DATA:  Acute onset of right shoulder pain and right neck pain. Initial encounter. EXAM: CHEST  2 VIEW COMPARISON:  Chest radiograph performed 08/27/2014 FINDINGS: The lungs are well-aerated. Vascular congestion is noted. Peribronchial thickening is seen. There is no evidence of focal opacification, pleural effusion or pneumothorax. The heart is borderline normal in size. The patient is status post median sternotomy. A pacemaker is noted at the left chest wall, with leads ending at the right atrium and right ventricle. No acute osseous abnormalities are seen. IMPRESSION: Vascular congestion and peribronchial thickening noted. Lungs otherwise grossly clear. Electronically Signed   By: Garald Balding M.D.   On: 01/10/2016 23:06   Dg Shoulder Right 01/10/2016  CLINICAL DATA:  Right shoulder pain for several hours, no known injury, initial encounter EXAM: RIGHT SHOULDER - 2+ VIEW COMPARISON:  None. FINDINGS: There is no evidence of fracture or dislocation. There  is no evidence of arthropathy or other focal bone abnormality. Soft tissues are unremarkable. IMPRESSION: No acute abnormality noted. Electronically Signed   By: Inez Catalina M.D.   On: 01/10/2016 21:37   Ct Cervical Spine Wo Contrast 01/10/2016  CLINICAL DATA:  Right shoulder and neck pain starting today. No known injury. EXAM: CT CERVICAL SPINE WITHOUT CONTRAST TECHNIQUE: Multidetector CT imaging of the cervical spine was performed without intravenous contrast. Multiplanar CT image reconstructions were also generated. COMPARISON:  None. FINDINGS: Normal alignment of the cervical spine. Mild degenerative changes with narrowed interspaces and endplate hypertrophic changes throughout. No vertebral compression deformities.  No prevertebral soft tissue swelling. C1-2 articulation appears intact. Vascular calcifications. Soft tissues are unremarkable. Emphysematous changes in the upper lungs. Vascular calcifications. IMPRESSION: Degenerative changes in the cervical spine. Normal alignment. No acute displaced fractures identified. Electronically Signed   By: Lucienne Capers M.D.   On: 01/10/2016 23:06    2345:  Pt feels better after pain meds and wants to go home now. INR mildly elevated. Pt has not taken her coumadin tonight; will have pt hold for tonight and resume tomorrow. Pt states she "just saw my doctor for this today" and "my INR was 3.7." States her coumadin has already been adjusted by her PMD today. Tx symptomatically, f/u PMD. Dx and testing d/w pt and family.  Questions answered.  Verb understanding, agreeable to d/c home with outpt f/u.   Francine Graven, DO 01/13/16 0021

## 2016-01-10 NOTE — ED Notes (Signed)
Pt c/o rt shoulder pain since waking up this morning. Pt states pain is shooting all over.

## 2016-01-10 NOTE — Discharge Instructions (Signed)
Take the prescription as directed.  Apply moist heat or ice to the area(s) of discomfort, for 15 minutes at a time, several times per day for the next few days.  Do not fall asleep on a heating or ice pack. Your INR was elevated today. Hold your coumadin dose tonight and resume tomorrow, per your new dosing by your doctor today. Call your regular medical doctor tomorrow to schedule a follow up appointment in the next 2 days.  Return to the Emergency Department immediately if worsening.

## 2016-01-11 DIAGNOSIS — M62838 Other muscle spasm: Secondary | ICD-10-CM | POA: Diagnosis not present

## 2016-01-11 MED ORDER — HYDROCODONE-ACETAMINOPHEN 5-325 MG PO TABS
1.0000 | ORAL_TABLET | Freq: Once | ORAL | Status: AC
  Administered 2016-01-11: 1 via ORAL
  Filled 2016-01-11: qty 1

## 2016-01-14 DIAGNOSIS — M62838 Other muscle spasm: Secondary | ICD-10-CM | POA: Diagnosis not present

## 2016-01-14 DIAGNOSIS — R071 Chest pain on breathing: Secondary | ICD-10-CM | POA: Diagnosis not present

## 2016-01-14 DIAGNOSIS — E11649 Type 2 diabetes mellitus with hypoglycemia without coma: Secondary | ICD-10-CM | POA: Diagnosis not present

## 2016-01-23 DIAGNOSIS — E119 Type 2 diabetes mellitus without complications: Secondary | ICD-10-CM | POA: Diagnosis not present

## 2016-01-23 DIAGNOSIS — I482 Chronic atrial fibrillation: Secondary | ICD-10-CM | POA: Diagnosis not present

## 2016-01-30 ENCOUNTER — Ambulatory Visit (INDEPENDENT_AMBULATORY_CARE_PROVIDER_SITE_OTHER): Payer: Medicare Other | Admitting: *Deleted

## 2016-01-30 ENCOUNTER — Telehealth: Payer: Self-pay | Admitting: Cardiology

## 2016-01-30 DIAGNOSIS — Z950 Presence of cardiac pacemaker: Secondary | ICD-10-CM

## 2016-01-30 DIAGNOSIS — I480 Paroxysmal atrial fibrillation: Secondary | ICD-10-CM

## 2016-01-30 NOTE — Progress Notes (Signed)
Remote pacemaker transmission.   

## 2016-01-30 NOTE — Telephone Encounter (Signed)
Spoke with pt and reminded pt of remote transmission that is due today. Pt verbalized understanding.   

## 2016-02-01 ENCOUNTER — Encounter: Payer: Self-pay | Admitting: Cardiology

## 2016-02-01 LAB — CUP PACEART REMOTE DEVICE CHECK
Battery Impedance: 3966 Ohm
Battery Remaining Longevity: 9 mo
Battery Voltage: 2.68 V
Brady Statistic AP VS Percent: 99 %
Brady Statistic AS VS Percent: 0 %
Date Time Interrogation Session: 20170705144759
Implantable Lead Implant Date: 20090102
Implantable Lead Location: 753860
Implantable Lead Model: 5076
Lead Channel Impedance Value: 425 Ohm
Lead Channel Impedance Value: 480 Ohm
Lead Channel Pacing Threshold Amplitude: 0.625 V
Lead Channel Pacing Threshold Pulse Width: 0.4 ms
Lead Channel Setting Pacing Amplitude: 1.5 V
Lead Channel Setting Pacing Amplitude: 2 V
Lead Channel Setting Pacing Pulse Width: 0.4 ms
Lead Channel Setting Sensing Sensitivity: 4 mV
MDC IDC LEAD IMPLANT DT: 20090102
MDC IDC LEAD LOCATION: 753859
MDC IDC MSMT LEADCHNL RA PACING THRESHOLD PULSEWIDTH: 0.4 ms
MDC IDC MSMT LEADCHNL RV PACING THRESHOLD AMPLITUDE: 1 V
MDC IDC MSMT LEADCHNL RV SENSING INTR AMPL: 11.2 mV
MDC IDC STAT BRADY AP VP PERCENT: 1 %
MDC IDC STAT BRADY AS VP PERCENT: 0 %

## 2016-02-29 DIAGNOSIS — I482 Chronic atrial fibrillation: Secondary | ICD-10-CM | POA: Diagnosis not present

## 2016-02-29 DIAGNOSIS — E11649 Type 2 diabetes mellitus with hypoglycemia without coma: Secondary | ICD-10-CM | POA: Diagnosis not present

## 2016-03-19 DIAGNOSIS — I482 Chronic atrial fibrillation: Secondary | ICD-10-CM | POA: Diagnosis not present

## 2016-03-19 DIAGNOSIS — Z7901 Long term (current) use of anticoagulants: Secondary | ICD-10-CM | POA: Diagnosis not present

## 2016-03-19 DIAGNOSIS — E119 Type 2 diabetes mellitus without complications: Secondary | ICD-10-CM | POA: Diagnosis not present

## 2016-04-09 DIAGNOSIS — E11649 Type 2 diabetes mellitus with hypoglycemia without coma: Secondary | ICD-10-CM | POA: Diagnosis not present

## 2016-04-09 DIAGNOSIS — Z23 Encounter for immunization: Secondary | ICD-10-CM | POA: Diagnosis not present

## 2016-04-09 DIAGNOSIS — I482 Chronic atrial fibrillation: Secondary | ICD-10-CM | POA: Diagnosis not present

## 2016-04-30 ENCOUNTER — Ambulatory Visit (INDEPENDENT_AMBULATORY_CARE_PROVIDER_SITE_OTHER): Payer: Medicare Other | Admitting: *Deleted

## 2016-04-30 ENCOUNTER — Telehealth: Payer: Self-pay | Admitting: Cardiology

## 2016-04-30 DIAGNOSIS — I480 Paroxysmal atrial fibrillation: Secondary | ICD-10-CM

## 2016-04-30 DIAGNOSIS — Z950 Presence of cardiac pacemaker: Secondary | ICD-10-CM

## 2016-04-30 NOTE — Telephone Encounter (Signed)
LMOVM reminding pt to send remote transmission.   

## 2016-04-30 NOTE — Progress Notes (Signed)
Remote pacemaker transmission.   

## 2016-05-02 ENCOUNTER — Encounter: Payer: Self-pay | Admitting: Cardiology

## 2016-05-09 LAB — CUP PACEART REMOTE DEVICE CHECK
Battery Impedance: 4344 Ohm
Brady Statistic AP VP Percent: 1 %
Brady Statistic AP VS Percent: 99 %
Brady Statistic AS VS Percent: 0 %
Date Time Interrogation Session: 20171004151431
Implantable Lead Implant Date: 20090102
Implantable Lead Implant Date: 20090102
Implantable Lead Location: 753859
Implantable Lead Location: 753860
Implantable Lead Model: 5076
Implantable Lead Model: 5076
Lead Channel Impedance Value: 438 Ohm
Lead Channel Impedance Value: 467 Ohm
Lead Channel Pacing Threshold Amplitude: 0.625 V
Lead Channel Pacing Threshold Pulse Width: 0.4 ms
Lead Channel Setting Pacing Amplitude: 2 V
Lead Channel Setting Pacing Pulse Width: 0.4 ms
Lead Channel Setting Sensing Sensitivity: 5.6 mV
MDC IDC MSMT BATTERY REMAINING LONGEVITY: 7 mo
MDC IDC MSMT BATTERY VOLTAGE: 2.66 V
MDC IDC MSMT LEADCHNL RV PACING THRESHOLD AMPLITUDE: 0.875 V
MDC IDC MSMT LEADCHNL RV PACING THRESHOLD PULSEWIDTH: 0.4 ms
MDC IDC SET LEADCHNL RA PACING AMPLITUDE: 1.5 V
MDC IDC STAT BRADY AS VP PERCENT: 0 %

## 2016-05-28 DIAGNOSIS — E11649 Type 2 diabetes mellitus with hypoglycemia without coma: Secondary | ICD-10-CM | POA: Diagnosis not present

## 2016-05-28 DIAGNOSIS — Z7901 Long term (current) use of anticoagulants: Secondary | ICD-10-CM | POA: Diagnosis not present

## 2016-05-28 DIAGNOSIS — I482 Chronic atrial fibrillation: Secondary | ICD-10-CM | POA: Diagnosis not present

## 2016-06-09 DIAGNOSIS — L03119 Cellulitis of unspecified part of limb: Secondary | ICD-10-CM | POA: Diagnosis not present

## 2016-06-09 DIAGNOSIS — E11622 Type 2 diabetes mellitus with other skin ulcer: Secondary | ICD-10-CM | POA: Diagnosis not present

## 2016-06-11 DIAGNOSIS — Z7901 Long term (current) use of anticoagulants: Secondary | ICD-10-CM | POA: Diagnosis not present

## 2016-06-11 DIAGNOSIS — S8992XA Unspecified injury of left lower leg, initial encounter: Secondary | ICD-10-CM | POA: Diagnosis not present

## 2016-06-11 DIAGNOSIS — E11649 Type 2 diabetes mellitus with hypoglycemia without coma: Secondary | ICD-10-CM | POA: Diagnosis not present

## 2016-06-11 DIAGNOSIS — I482 Chronic atrial fibrillation: Secondary | ICD-10-CM | POA: Diagnosis not present

## 2016-06-12 DIAGNOSIS — Z950 Presence of cardiac pacemaker: Secondary | ICD-10-CM | POA: Diagnosis not present

## 2016-06-12 DIAGNOSIS — Z794 Long term (current) use of insulin: Secondary | ICD-10-CM | POA: Diagnosis not present

## 2016-06-12 DIAGNOSIS — L03116 Cellulitis of left lower limb: Secondary | ICD-10-CM | POA: Diagnosis not present

## 2016-06-12 DIAGNOSIS — E1151 Type 2 diabetes mellitus with diabetic peripheral angiopathy without gangrene: Secondary | ICD-10-CM | POA: Diagnosis not present

## 2016-06-12 DIAGNOSIS — I83028 Varicose veins of left lower extremity with ulcer other part of lower leg: Secondary | ICD-10-CM | POA: Diagnosis not present

## 2016-06-12 DIAGNOSIS — S81812S Laceration without foreign body, left lower leg, sequela: Secondary | ICD-10-CM | POA: Diagnosis not present

## 2016-06-12 DIAGNOSIS — E1165 Type 2 diabetes mellitus with hyperglycemia: Secondary | ICD-10-CM | POA: Diagnosis not present

## 2016-06-12 DIAGNOSIS — Z87891 Personal history of nicotine dependence: Secondary | ICD-10-CM | POA: Diagnosis not present

## 2016-06-12 DIAGNOSIS — I10 Essential (primary) hypertension: Secondary | ICD-10-CM | POA: Diagnosis not present

## 2016-06-12 DIAGNOSIS — Z9889 Other specified postprocedural states: Secondary | ICD-10-CM | POA: Diagnosis not present

## 2016-06-12 DIAGNOSIS — L97821 Non-pressure chronic ulcer of other part of left lower leg limited to breakdown of skin: Secondary | ICD-10-CM | POA: Diagnosis not present

## 2016-06-23 DIAGNOSIS — R7301 Impaired fasting glucose: Secondary | ICD-10-CM | POA: Diagnosis not present

## 2016-06-23 DIAGNOSIS — I482 Chronic atrial fibrillation: Secondary | ICD-10-CM | POA: Diagnosis not present

## 2016-06-23 DIAGNOSIS — I10 Essential (primary) hypertension: Secondary | ICD-10-CM | POA: Diagnosis not present

## 2016-06-23 DIAGNOSIS — E782 Mixed hyperlipidemia: Secondary | ICD-10-CM | POA: Diagnosis not present

## 2016-06-23 DIAGNOSIS — E119 Type 2 diabetes mellitus without complications: Secondary | ICD-10-CM | POA: Diagnosis not present

## 2016-06-23 DIAGNOSIS — E11649 Type 2 diabetes mellitus with hypoglycemia without coma: Secondary | ICD-10-CM | POA: Diagnosis not present

## 2016-06-23 DIAGNOSIS — E785 Hyperlipidemia, unspecified: Secondary | ICD-10-CM | POA: Diagnosis not present

## 2016-06-23 DIAGNOSIS — E039 Hypothyroidism, unspecified: Secondary | ICD-10-CM | POA: Diagnosis not present

## 2016-06-25 ENCOUNTER — Other Ambulatory Visit (HOSPITAL_COMMUNITY): Payer: Self-pay | Admitting: Internal Medicine

## 2016-06-25 ENCOUNTER — Ambulatory Visit (HOSPITAL_COMMUNITY)
Admission: RE | Admit: 2016-06-25 | Discharge: 2016-06-25 | Disposition: A | Discharge status: Home / Self Care | Payer: Medicare Other | Source: Ambulatory Visit | Attending: Internal Medicine | Admitting: Internal Medicine

## 2016-06-25 DIAGNOSIS — M25561 Pain in right knee: Secondary | ICD-10-CM

## 2016-06-25 DIAGNOSIS — W19XXXA Unspecified fall, initial encounter: Secondary | ICD-10-CM | POA: Diagnosis not present

## 2016-06-25 DIAGNOSIS — Z6828 Body mass index (BMI) 28.0-28.9, adult: Secondary | ICD-10-CM | POA: Diagnosis not present

## 2016-06-25 DIAGNOSIS — E782 Mixed hyperlipidemia: Secondary | ICD-10-CM | POA: Diagnosis not present

## 2016-06-25 DIAGNOSIS — M25461 Effusion, right knee: Secondary | ICD-10-CM | POA: Insufficient documentation

## 2016-06-25 DIAGNOSIS — D509 Iron deficiency anemia, unspecified: Secondary | ICD-10-CM | POA: Diagnosis not present

## 2016-06-25 DIAGNOSIS — Z7901 Long term (current) use of anticoagulants: Secondary | ICD-10-CM | POA: Diagnosis not present

## 2016-06-25 DIAGNOSIS — I482 Chronic atrial fibrillation: Secondary | ICD-10-CM | POA: Diagnosis not present

## 2016-06-25 DIAGNOSIS — I10 Essential (primary) hypertension: Secondary | ICD-10-CM | POA: Diagnosis not present

## 2016-06-25 DIAGNOSIS — E11649 Type 2 diabetes mellitus with hypoglycemia without coma: Secondary | ICD-10-CM | POA: Diagnosis not present

## 2016-06-26 DIAGNOSIS — S81011S Laceration without foreign body, right knee, sequela: Secondary | ICD-10-CM | POA: Diagnosis not present

## 2016-06-26 DIAGNOSIS — E1151 Type 2 diabetes mellitus with diabetic peripheral angiopathy without gangrene: Secondary | ICD-10-CM | POA: Diagnosis not present

## 2016-06-26 DIAGNOSIS — L97821 Non-pressure chronic ulcer of other part of left lower leg limited to breakdown of skin: Secondary | ICD-10-CM | POA: Diagnosis not present

## 2016-06-26 DIAGNOSIS — I10 Essential (primary) hypertension: Secondary | ICD-10-CM | POA: Diagnosis not present

## 2016-06-26 DIAGNOSIS — Z87891 Personal history of nicotine dependence: Secondary | ICD-10-CM | POA: Diagnosis not present

## 2016-06-26 DIAGNOSIS — E1165 Type 2 diabetes mellitus with hyperglycemia: Secondary | ICD-10-CM | POA: Diagnosis not present

## 2016-06-26 DIAGNOSIS — Z9181 History of falling: Secondary | ICD-10-CM | POA: Diagnosis not present

## 2016-06-26 DIAGNOSIS — S81812S Laceration without foreign body, left lower leg, sequela: Secondary | ICD-10-CM | POA: Diagnosis not present

## 2016-06-26 DIAGNOSIS — Z950 Presence of cardiac pacemaker: Secondary | ICD-10-CM | POA: Diagnosis not present

## 2016-06-26 DIAGNOSIS — Z794 Long term (current) use of insulin: Secondary | ICD-10-CM | POA: Diagnosis not present

## 2016-06-26 DIAGNOSIS — Z9889 Other specified postprocedural states: Secondary | ICD-10-CM | POA: Diagnosis not present

## 2016-06-26 DIAGNOSIS — I83018 Varicose veins of right lower extremity with ulcer other part of lower leg: Secondary | ICD-10-CM | POA: Diagnosis not present

## 2016-06-26 DIAGNOSIS — I83028 Varicose veins of left lower extremity with ulcer other part of lower leg: Secondary | ICD-10-CM | POA: Diagnosis not present

## 2016-06-26 DIAGNOSIS — L97811 Non-pressure chronic ulcer of other part of right lower leg limited to breakdown of skin: Secondary | ICD-10-CM | POA: Diagnosis not present

## 2016-06-26 DIAGNOSIS — S81811S Laceration without foreign body, right lower leg, sequela: Secondary | ICD-10-CM | POA: Diagnosis not present

## 2016-06-26 DIAGNOSIS — L03116 Cellulitis of left lower limb: Secondary | ICD-10-CM | POA: Diagnosis not present

## 2016-07-10 DIAGNOSIS — Z9889 Other specified postprocedural states: Secondary | ICD-10-CM | POA: Diagnosis not present

## 2016-07-10 DIAGNOSIS — I83028 Varicose veins of left lower extremity with ulcer other part of lower leg: Secondary | ICD-10-CM | POA: Diagnosis not present

## 2016-07-10 DIAGNOSIS — Z794 Long term (current) use of insulin: Secondary | ICD-10-CM | POA: Diagnosis not present

## 2016-07-10 DIAGNOSIS — S81811S Laceration without foreign body, right lower leg, sequela: Secondary | ICD-10-CM | POA: Diagnosis not present

## 2016-07-10 DIAGNOSIS — S81812S Laceration without foreign body, left lower leg, sequela: Secondary | ICD-10-CM | POA: Diagnosis not present

## 2016-07-10 DIAGNOSIS — E1151 Type 2 diabetes mellitus with diabetic peripheral angiopathy without gangrene: Secondary | ICD-10-CM | POA: Diagnosis not present

## 2016-07-10 DIAGNOSIS — Z950 Presence of cardiac pacemaker: Secondary | ICD-10-CM | POA: Diagnosis not present

## 2016-07-10 DIAGNOSIS — L03116 Cellulitis of left lower limb: Secondary | ICD-10-CM | POA: Diagnosis not present

## 2016-07-10 DIAGNOSIS — L97821 Non-pressure chronic ulcer of other part of left lower leg limited to breakdown of skin: Secondary | ICD-10-CM | POA: Diagnosis not present

## 2016-07-10 DIAGNOSIS — L97811 Non-pressure chronic ulcer of other part of right lower leg limited to breakdown of skin: Secondary | ICD-10-CM | POA: Diagnosis not present

## 2016-07-10 DIAGNOSIS — I83029 Varicose veins of left lower extremity with ulcer of unspecified site: Secondary | ICD-10-CM | POA: Diagnosis not present

## 2016-07-10 DIAGNOSIS — Z87891 Personal history of nicotine dependence: Secondary | ICD-10-CM | POA: Diagnosis not present

## 2016-07-10 DIAGNOSIS — E1165 Type 2 diabetes mellitus with hyperglycemia: Secondary | ICD-10-CM | POA: Diagnosis not present

## 2016-07-10 DIAGNOSIS — I83019 Varicose veins of right lower extremity with ulcer of unspecified site: Secondary | ICD-10-CM | POA: Diagnosis not present

## 2016-07-10 DIAGNOSIS — I10 Essential (primary) hypertension: Secondary | ICD-10-CM | POA: Diagnosis not present

## 2016-07-10 DIAGNOSIS — S81011S Laceration without foreign body, right knee, sequela: Secondary | ICD-10-CM | POA: Diagnosis not present

## 2016-07-10 DIAGNOSIS — T8133XD Disruption of traumatic injury wound repair, subsequent encounter: Secondary | ICD-10-CM | POA: Diagnosis not present

## 2016-07-10 DIAGNOSIS — I83018 Varicose veins of right lower extremity with ulcer other part of lower leg: Secondary | ICD-10-CM | POA: Diagnosis not present

## 2016-07-10 DIAGNOSIS — Z9181 History of falling: Secondary | ICD-10-CM | POA: Diagnosis not present

## 2016-07-23 DIAGNOSIS — I482 Chronic atrial fibrillation: Secondary | ICD-10-CM | POA: Diagnosis not present

## 2016-07-25 DIAGNOSIS — I83018 Varicose veins of right lower extremity with ulcer other part of lower leg: Secondary | ICD-10-CM | POA: Diagnosis not present

## 2016-07-25 DIAGNOSIS — L97811 Non-pressure chronic ulcer of other part of right lower leg limited to breakdown of skin: Secondary | ICD-10-CM | POA: Diagnosis not present

## 2016-07-25 DIAGNOSIS — E1151 Type 2 diabetes mellitus with diabetic peripheral angiopathy without gangrene: Secondary | ICD-10-CM | POA: Diagnosis not present

## 2016-07-25 DIAGNOSIS — L97821 Non-pressure chronic ulcer of other part of left lower leg limited to breakdown of skin: Secondary | ICD-10-CM | POA: Diagnosis not present

## 2016-07-25 DIAGNOSIS — I83028 Varicose veins of left lower extremity with ulcer other part of lower leg: Secondary | ICD-10-CM | POA: Diagnosis not present

## 2016-08-06 ENCOUNTER — Ambulatory Visit (INDEPENDENT_AMBULATORY_CARE_PROVIDER_SITE_OTHER): Payer: Medicare Other | Admitting: Cardiovascular Disease

## 2016-08-06 ENCOUNTER — Ambulatory Visit (INDEPENDENT_AMBULATORY_CARE_PROVIDER_SITE_OTHER): Payer: Medicare Other | Admitting: Pharmacist Clinician (PhC)/ Clinical Pharmacy Specialist

## 2016-08-06 ENCOUNTER — Encounter: Payer: Self-pay | Admitting: Cardiovascular Disease

## 2016-08-06 VITALS — BP 125/57 | HR 82 | Ht 62.5 in | Wt 141.0 lb

## 2016-08-06 DIAGNOSIS — D509 Iron deficiency anemia, unspecified: Secondary | ICD-10-CM | POA: Diagnosis not present

## 2016-08-06 DIAGNOSIS — E782 Mixed hyperlipidemia: Secondary | ICD-10-CM

## 2016-08-06 DIAGNOSIS — E11649 Type 2 diabetes mellitus with hypoglycemia without coma: Secondary | ICD-10-CM | POA: Diagnosis not present

## 2016-08-06 DIAGNOSIS — Z7901 Long term (current) use of anticoagulants: Secondary | ICD-10-CM

## 2016-08-06 DIAGNOSIS — I10 Essential (primary) hypertension: Secondary | ICD-10-CM | POA: Diagnosis not present

## 2016-08-06 DIAGNOSIS — Z950 Presence of cardiac pacemaker: Secondary | ICD-10-CM

## 2016-08-06 DIAGNOSIS — I480 Paroxysmal atrial fibrillation: Secondary | ICD-10-CM | POA: Diagnosis not present

## 2016-08-06 DIAGNOSIS — I482 Chronic atrial fibrillation: Secondary | ICD-10-CM | POA: Diagnosis not present

## 2016-08-06 DIAGNOSIS — I447 Left bundle-branch block, unspecified: Secondary | ICD-10-CM | POA: Diagnosis not present

## 2016-08-06 DIAGNOSIS — I739 Peripheral vascular disease, unspecified: Secondary | ICD-10-CM

## 2016-08-06 DIAGNOSIS — Z6825 Body mass index (BMI) 25.0-25.9, adult: Secondary | ICD-10-CM | POA: Diagnosis not present

## 2016-08-06 DIAGNOSIS — I2510 Atherosclerotic heart disease of native coronary artery without angina pectoris: Secondary | ICD-10-CM

## 2016-08-06 DIAGNOSIS — I495 Sick sinus syndrome: Secondary | ICD-10-CM

## 2016-08-06 LAB — POCT INR: INR: 2.6

## 2016-08-06 MED ORDER — LISINOPRIL 2.5 MG PO TABS: 2.5000 mg | ORAL_TABLET | Freq: Every day | ORAL | 11 refills | Status: DC

## 2016-08-06 NOTE — Patient Instructions (Addendum)
Dr Sallyanne Kuster has recommended making the following medication changes: 1. STOP Aspirin 2. DECREASE Lisinopril to 2.5 mg daily  Remote monitoring is used to monitor your Pacemaker of ICD from home. This monitoring reduces the number of office visits required to check your device to one time per year. It allows [place] to keep an eye on the functioning of your device to ensure it is working properly. You are scheduled for a device check from home on Monday, February 12th, 2018. You may send your transmission at any time that day. If you have a wireless device, the transmission will be sent automatically. After your physician reviews your transmission, you will receive a postcard with your next transmission date.  Dr Sallyanne Kuster recommends that you schedule a follow-up appointment in 6 months with a pacemaker check. You will receive a reminder letter in the mail two months in advance. If you don't receive a letter, please call our office to schedule the follow-up appointment.  If you need a refill on your cardiac medications before your next appointment, please call your pharmacy.

## 2016-08-06 NOTE — Progress Notes (Signed)
Patient ID: Megan Haley, female   DOB: [birth year], 83 y.o.   MRN: [id number]    Cardiology Office Note    Date:  08/07/2016   ID:  KIZZI MANNOR, DOB 19-Jun-1932, MRN [id number]  PCP:  Wende Neighbors, MD  Cardiologist:   Sanda Klein, MD   No chief complaint on file.   History of Present Illness:  Megan Haley is a 83 y.o. female returning in follow-up for Pacemaker check, atrial fibrillation, coronary artery disease   She has had multiple falls recently which show blames on "tripping". She denies feeling dizzy or losing consciousness. She feels she just has poor balance. Her diastolic blood pressure is consistently low.  Pacemaker interrogation shows normal device function, but is likely to reach elective replacement interval and the next 3 months or so. Her device was implanted in 2009 and is a Medtronic Adapta dual-chamber pacemaker. Lead parameters remain excellent She has virtually 100% atrial pacing but only 0.6% ventricular pacing. Heart rate histogram is slightly blunted but appropriate for her sedentary lifestyle. No episodes of atrial fibrillation have been recorded. A few episodes of high ventricular rate are recorded but the longest one is only 10 seconds. Most of them appear to be atrial tachycardia with one-to-one conduction but at least one of them may represent nonsustained ventricular tachycardia.   She had a myocardial infarction in 1993 and underwent multivessel bypass surgery in [place]. She had three-vessel bypass surgery (repeat cardiac catheterization in 2007 showed 80% proximal LAD, totally occluded circumflex, totally occluded RCA, patent LIMA to LAD, patent SVG to OM, total occlusion of the SVG to RCA, LVEF 40% with inferior akinesia sent anteroapical hypokinesis). A nuclear stress test performed in June 2014 showed inferior and anteroapical scar with minimal peri-infarct ischemia. EF by scintigraphy was 51%. She received a dual-chamber Medtronic  Adapta permanent pacemaker in 2009 for sinus node dysfunction with symptomatic bradycardia. She has a chronic LBBB. She is not pacemaker dependent, but has pacing in the atrium virtually 100% of the time. She does not require ventricular pacing.  She also has peripheral arterial disease of the lower extremities with a high-grade mid to distal left SFA stenosis estimated to be 70-99% in severity by her last duplex ultrasound in January of this year. She has mild nonobstructive carotid atherosclerosis (she has a high-grade stenosis in the left external carotid).   Past Medical History:  Diagnosis Date  . Bladder cancer [facility]) 2007   surgery & chemo  . CAD (coronary artery disease)   . Deafness in right ear   . Former smoker   . HOH (hard of hearing)   . Hyperlipidemia   . IDDM (insulin dependent diabetes mellitus) ([place])   . MI (myocardial infarction) 1993   CABG in [place]  . Pacemaker   . PAD (peripheral artery disease) ([place])   . PAF (paroxysmal atrial fibrillation) ([place])     Past Surgical History:  Procedure Laterality Date  . ABDOMINAL HYSTERECTOMY  1900  . BLADDER REPAIR  1990  . BLADDER SURGERY  2007   r/t bladder cancer  . CARDIAC CATHETERIZATION  04/03/2006   [facility] diffusely disease, LAD w/50-60% diffuse disease and 80% lesion in prox/mid region; LCfx totally occluded proximally; RCA with 70% prox lesion after which RCA was totally occluded; patent LIMA-LAD; VG to RCA occluded; EF 40% (Dr. Marisa Cyphers)  . CAROTID DOPPLER  07/2012   mild amount of fibrous plaque in bilateral bulb/prox ICA; LECA  with elevated velocities  . CARPAL TUNNEL RELEASE    . CATARACT EXTRACTION Bilateral   . CORONARY ARTERY BYPASS GRAFT  1993   in [place]  - LIMA to LAD, VG to PDA, VG and marginal   . CYSTOSCOPY W/ RETROGRADES Bilateral 10/31/2014   Procedure: CYSTOSCOPY WITH BILATERAL RETROGRADE PYELOGRAM AND RENAL WASHINGS FOR CYTOLOGY WITH BLADDER BIOPSY;  Surgeon: Franchot Gallo,  MD;  Location: [facility];  Service: Urology;  Laterality: Bilateral;  . LOWER EXTREMITY ARTERIAL DOPPLER  07/2012   moderate to severe arterial insufficiency, diffuse plaque in R SFA & less than 50% diameter reduction; L SFA with diffuse plaque & 70-99% diameter reduction  . NM MYOCAR PERF WALL MOTION  12/2012   lexiscan - low riskwith medium-large size, severe in intensity fixed defect consistent with previous infarction & small size, mild intensity fixed defect in distal anterior to anteroapical region consistent with previous infarction  . PACEMAKER INSERTION  07/30/2007   Medtronic Adapta ADDR01 - Dr. Gerrie Nordmann - symptomatic bradycardia  . TRANSTHORACIC ECHOCARDIOGRAM  01/2013   EF 40-45%, mild LVH; ventricular spetum with abnormal function & dysynergy; calcified MV annulus, mild MR; PA peak pressure 78mmHg  . UPPER EXTREMITY ARTERIAL DOPPLER  01/2013   non-compressible calcified bilateral brachial pressure, bilat digits WNL  . YAG LASER APPLICATION Right [id number]   Procedure: YAG LASER APPLICATION;  Surgeon: Elta Guadeloupe T. Gershon Crane, MD;  Location: [facility];  Service: Ophthalmology;  Laterality: Right;    Current Outpatient Prescriptions  Medication Sig Dispense Refill  . Biotin 5000 MCG CAPS Take 1 capsule by mouth daily.    . Cholecalciferol (VITAMIN D-3) 1000 UNITS CAPS Take 2,000 Units by mouth every morning.     . Choline Fenofibrate (TRILIPIX) 135 MG capsule Take 135 mg by mouth every morning.    . ferrous sulfate 325 (65 FE) MG tablet Take 325 mg by mouth daily with breakfast.    . HYDROcodone-acetaminophen (NORCO/VICODIN) 5-325 MG tablet 1 tab PO q12 hours prn pain 8 tablet 0  . lisinopril (PRINIVIL,ZESTRIL) 2.5 MG tablet Take 1 tablet (2.5 mg total) by mouth daily. 30 tablet 11  . metoprolol succinate (TOPROL-XL) 25 MG 24 hr tablet Take 25 mg by mouth every morning.     . sertraline (ZOLOFT) 50 MG tablet Take 50 mg by mouth at bedtime.     . simvastatin (ZOCOR) 20 MG tablet Take 20 mg by mouth  at bedtime.     Marland Kitchen warfarin (COUMADIN) 2 MG tablet Take 1-2 mg by mouth See admin instructions. Patient takes 2mg  DAILY EXCEPT Monday AND Friday 1 mg    . linagliptin (TRADJENTA) 5 MG TABS tablet Take 5 mg by mouth daily.     No current facility-administered medications for this visit.     Allergies:   Lisinopril   Social History   Social History  . Marital status: Single    Spouse name: N/A  . Number of children: 3  . Years of education: 70   Social History Main Topics  . Smoking status: Former Smoker    Packs/day: 1.00    Years: 37.00    Types: Cigarettes    Quit date: 11/28/1990  . Smokeless tobacco: Never Used  . Alcohol use No  . Drug use: No  . Sexual activity: Not Asked   Other Topics Concern  . None   Social History Narrative  . None     Family History:  The patient's family history includes Appendicitis in her maternal grandmother; Asthma  in her paternal grandfather; Bladder Cancer in her sister; Diabetes in her father, paternal grandmother, and sister; Heart attack (age of onset: 70) in her father; Heart disease in her paternal grandmother; Hypertension in her child and mother; Pneumonia in her mother; Skin cancer in her mother.   ROS:   Please see the history of present illness.    Review of Systems  All other systems reviewed and are negative.    PHYSICAL EXAM:   VS:  BP (!) 125/57   Pulse 82   Ht 5' 2.5" (1.588 m)   Wt 64 kg (141 lb)   BMI 25.38 kg/m    GEN: Well nourished, well developed, in no acute distress  HEENT: normal  Neck: no JVD, carotid bruits, or masses Cardiac: RRR; normal first and paradoxically split second heart sounds, no rubs or gallops, 2/6 early peaking systolic ejection murmur in the aortic focus , rubs, or gallops,no edema; healthy left subclavian pacemaker site  2+ right femoral, 1+ right posterior tibial and dorsalis pedis pulses; 2+ left femoral, very weak left posterior tibial and dorsalis pedis pulses; no subclavian or  femoral bruits  Respiratory:  clear to auscultation bilaterally, normal work of breathing GI: soft, nontender, nondistended, + BS MS: no deformity or atrophy  Skin: warm and dry, no rash Neuro:  Alert and Oriented x 3, Strength and sensation are intact Psych: euthymic mood, full affect  Wt Readings from Last 3 Encounters:  08/06/16 64 kg (141 lb)  01/10/16 67.6 kg (149 lb)  07/17/15 68.6 kg (151 lb 5 oz)      Studies/Labs Reviewed:   EKG:  EKG is ordered today.  The ekg ordered today demonstrates atrial paced, ventricular sensed rhythm, chronicNonspecific IVCD/atypical left bundle branch block. QRS 146 ms  Recent Labs: No results found for requested labs within last 8760 hours.     ASSESSMENT:    1. Paroxysmal atrial fibrillation (HCC)   2. Long term current use of anticoagulant therapy   3. SSS (sick sinus syndrome) ([place])   4. Pacemaker   5. Coronary artery disease involving native coronary artery of native heart without angina pectoris   6. PAD (peripheral artery disease) ([place])   7. LBBB (left bundle branch block)   8. Mixed hyperlipidemia      PLAN:  In order of problems listed above:  1. AFib: No recent episodes of atrial fibrillation recorded by her pacemaker. Embolic risk is high. Chadsvasc score is 41 (age 40, gender, vascular disease, diabetes mellitus).  2. Warfarin: I am very worried about her falls. At least we will stop her aspirin. If we cannot prevent falls, may have to reconsider the wisdom of chronic anticoagulation, not withstanding the high embolic risk. Hopefully now that she has moved into a new home she will have fewer incidents. 3. SSS: Heart rate histograms are rather blunted but she is also fairly sedentary. No changes made. 4. PPM approaching ERI: Normally functioning dual-chamber permanent pacemaker. Increase frequency of remote downloads to monthly. Anticipate generator change out this spring. Discussed the symptoms of asynchronous ventricular  pacing that could warn her that the time is ready for device change. We'll probably perform the generator change out without discontinuation of warfarin. 5. CAD s/p CABG: No symptoms of active coronary insufficiency. 6. PAD: No symptoms of claudication 7. Atypical LBBB: Despite the presence of IVCD, no need for ventricular pacing with MVP on 8. HLP: Will obtain more recent lab results from her primary care physician for both glycemic control  and most recent lipid profile. 9. HTN: I'm worried that her falls may be related to her low diastolic blood pressure. We'll reduce her dose of lisinopril. He was taking this medication not so much for hypertension as she was for renal protection in the setting of diabetes.   Medication Adjustments/Labs and Tests Ordered: Current medicines are reviewed at length with the patient today.  Concerns regarding medicines are outlined above.  Medication changes, Labs and Tests ordered today are listed below. Patient Instructions  Dr Sallyanne Kuster has recommended making the following medication changes: 1. STOP Aspirin 2. DECREASE Lisinopril to 2.5 mg daily  Remote monitoring is used to monitor your Pacemaker of ICD from home. This monitoring reduces the number of office visits required to check your device to one time per year. It allows [place] to keep an eye on the functioning of your device to ensure it is working properly. You are scheduled for a device check from home on Monday, February 12th, 2018. You may send your transmission at any time that day. If you have a wireless device, the transmission will be sent automatically. After your physician reviews your transmission, you will receive a postcard with your next transmission date.  Dr Sallyanne Kuster recommends that you schedule a follow-up appointment in 6 months with a pacemaker check. You will receive a reminder letter in the mail two months in advance. If you don't receive a letter, please call our office to schedule the  follow-up appointment.  If you need a refill on your cardiac medications before your next appointment, please call your pharmacy.   Signed, Sanda Klein, MD  08/07/2016 6:25 PM    [address], [place], [place]  [address] Phone: [phone]; Fax: [phone]

## 2016-08-08 DIAGNOSIS — I872 Venous insufficiency (chronic) (peripheral): Secondary | ICD-10-CM | POA: Diagnosis not present

## 2016-08-08 DIAGNOSIS — L97821 Non-pressure chronic ulcer of other part of left lower leg limited to breakdown of skin: Secondary | ICD-10-CM | POA: Diagnosis not present

## 2016-08-08 DIAGNOSIS — L97811 Non-pressure chronic ulcer of other part of right lower leg limited to breakdown of skin: Secondary | ICD-10-CM | POA: Diagnosis not present

## 2016-08-18 LAB — CUP PACEART INCLINIC DEVICE CHECK
Battery Impedance: 5264 Ohm
Battery Remaining Longevity: 3 mo
Battery Voltage: 2.63 V
Brady Statistic AP VP Percent: 1 %
Brady Statistic AP VS Percent: 99 %
Brady Statistic AS VS Percent: 0 %
Date Time Interrogation Session: 20180110144951
Implantable Lead Implant Date: 20090102
Implantable Lead Implant Date: 20090102
Implantable Lead Model: 5076
Implantable Lead Model: 5076
Implantable Pulse Generator Implant Date: 20090102
Lead Channel Impedance Value: 443 Ohm
Lead Channel Impedance Value: 492 Ohm
Lead Channel Pacing Threshold Amplitude: 0.625 V
Lead Channel Sensing Intrinsic Amplitude: 11.2 mV
Lead Channel Setting Pacing Pulse Width: 0.4 ms
MDC IDC LEAD LOCATION: 753859
MDC IDC LEAD LOCATION: 753860
MDC IDC MSMT LEADCHNL RA PACING THRESHOLD PULSEWIDTH: 0.4 ms
MDC IDC MSMT LEADCHNL RV PACING THRESHOLD AMPLITUDE: 0.875 V
MDC IDC MSMT LEADCHNL RV PACING THRESHOLD PULSEWIDTH: 0.4 ms
MDC IDC SET LEADCHNL RA PACING AMPLITUDE: 1.5 V
MDC IDC SET LEADCHNL RV PACING AMPLITUDE: 2 V
MDC IDC SET LEADCHNL RV SENSING SENSITIVITY: 4 mV
MDC IDC STAT BRADY AS VP PERCENT: 0 %

## 2016-08-21 DIAGNOSIS — T8133XD Disruption of traumatic injury wound repair, subsequent encounter: Secondary | ICD-10-CM | POA: Diagnosis not present

## 2016-08-21 DIAGNOSIS — I83029 Varicose veins of left lower extremity with ulcer of unspecified site: Secondary | ICD-10-CM | POA: Diagnosis not present

## 2016-08-21 DIAGNOSIS — I83019 Varicose veins of right lower extremity with ulcer of unspecified site: Secondary | ICD-10-CM | POA: Diagnosis not present

## 2016-08-25 ENCOUNTER — Ambulatory Visit (INDEPENDENT_AMBULATORY_CARE_PROVIDER_SITE_OTHER): Payer: Medicare Other | Admitting: Pharmacist Clinician (PhC)/ Clinical Pharmacy Specialist

## 2016-08-25 DIAGNOSIS — Z7901 Long term (current) use of anticoagulants: Secondary | ICD-10-CM | POA: Diagnosis not present

## 2016-08-25 DIAGNOSIS — I480 Paroxysmal atrial fibrillation: Secondary | ICD-10-CM

## 2016-08-25 LAB — POCT INR: INR: 1.5

## 2016-08-25 MED ORDER — LISINOPRIL 2.5 MG PO TABS: 2.5000 mg | ORAL_TABLET | Freq: Every day | ORAL | 11 refills | Status: AC

## 2016-08-25 NOTE — Progress Notes (Signed)
Examined the patient after her fall. She has a small laceration on the dorsum of her nose and a very superficial abrasion above her left eyebrow. Her neurological exam is normal. She is fully alert and oriented and even able to joke. Pupils are equal round and reactive to light, cranial nerves II through XII are intact, grip and muscle strength in both upper and lower extremities are symmetrical, reflexes are normokinetic. Fortuitously, INR today was only 1.5. I had already come very close recommending discontinuation of her anticoagulant due to her frequent falls. The risk of serious injury and life-threatening intracranial hemorrhage seems to outweigh the risk of embolic stroke. Will stop the warfarin altogether. She will take aspirin 81 mg daily.  Sanda Klein, MD, [facility] [phone] office [phone] pager

## 2016-09-04 DIAGNOSIS — T8133XD Disruption of traumatic injury wound repair, subsequent encounter: Secondary | ICD-10-CM | POA: Diagnosis not present

## 2016-09-04 DIAGNOSIS — Z9181 History of falling: Secondary | ICD-10-CM | POA: Diagnosis not present

## 2016-09-04 DIAGNOSIS — I83029 Varicose veins of left lower extremity with ulcer of unspecified site: Secondary | ICD-10-CM | POA: Diagnosis not present

## 2016-09-04 DIAGNOSIS — L97821 Non-pressure chronic ulcer of other part of left lower leg limited to breakdown of skin: Secondary | ICD-10-CM | POA: Diagnosis not present

## 2016-09-04 DIAGNOSIS — I872 Venous insufficiency (chronic) (peripheral): Secondary | ICD-10-CM | POA: Diagnosis not present

## 2016-09-04 DIAGNOSIS — I83019 Varicose veins of right lower extremity with ulcer of unspecified site: Secondary | ICD-10-CM | POA: Diagnosis not present

## 2016-09-04 DIAGNOSIS — T8130XD Disruption of wound, unspecified, subsequent encounter: Secondary | ICD-10-CM | POA: Diagnosis not present

## 2016-09-04 DIAGNOSIS — L97811 Non-pressure chronic ulcer of other part of right lower leg limited to breakdown of skin: Secondary | ICD-10-CM | POA: Diagnosis not present

## 2016-09-08 ENCOUNTER — Ambulatory Visit (INDEPENDENT_AMBULATORY_CARE_PROVIDER_SITE_OTHER): Payer: Medicare Other | Admitting: *Deleted

## 2016-09-08 ENCOUNTER — Telehealth: Payer: Self-pay | Admitting: Cardiology

## 2016-09-08 DIAGNOSIS — I495 Sick sinus syndrome: Secondary | ICD-10-CM | POA: Diagnosis not present

## 2016-09-08 NOTE — Telephone Encounter (Signed)
LMOVM reminding pt to send remote transmission.   

## 2016-09-09 ENCOUNTER — Encounter: Payer: Self-pay | Admitting: Cardiology

## 2016-09-09 LAB — CUP PACEART REMOTE DEVICE CHECK
Battery Impedance: 5912 Ohm
Battery Remaining Longevity: 1 mo — CL
Battery Voltage: 2.63 V
Brady Statistic AP VP Percent: 1 %
Brady Statistic AP VS Percent: 99 %
Brady Statistic AS VP Percent: 0 %
Brady Statistic AS VS Percent: 0 %
Date Time Interrogation Session: 20180213002748
Implantable Lead Implant Date: 20090102
Implantable Lead Implant Date: 20090102
Implantable Lead Location: 753859
Implantable Lead Location: 753860
Implantable Lead Model: 5076
Implantable Lead Model: 5076
Implantable Pulse Generator Implant Date: 20090102
Lead Channel Impedance Value: 465 Ohm
Lead Channel Impedance Value: 511 Ohm
Lead Channel Pacing Threshold Amplitude: 0.5 V
Lead Channel Pacing Threshold Amplitude: 0.625 V
Lead Channel Pacing Threshold Pulse Width: 0.4 ms
Lead Channel Pacing Threshold Pulse Width: 0.4 ms
Lead Channel Sensing Intrinsic Amplitude: 8 mV
Lead Channel Setting Pacing Amplitude: 1.5 V
Lead Channel Setting Pacing Amplitude: 2 V
Lead Channel Setting Pacing Pulse Width: 0.4 ms
Lead Channel Setting Sensing Sensitivity: 5.6 mV

## 2016-09-09 NOTE — Progress Notes (Signed)
Remote pacemaker transmission.   

## 2016-09-30 DIAGNOSIS — S80821A Blister (nonthermal), right lower leg, initial encounter: Secondary | ICD-10-CM | POA: Diagnosis not present

## 2016-09-30 DIAGNOSIS — I872 Venous insufficiency (chronic) (peripheral): Secondary | ICD-10-CM | POA: Diagnosis not present

## 2016-09-30 DIAGNOSIS — I83019 Varicose veins of right lower extremity with ulcer of unspecified site: Secondary | ICD-10-CM | POA: Diagnosis not present

## 2016-09-30 DIAGNOSIS — Z9181 History of falling: Secondary | ICD-10-CM | POA: Diagnosis not present

## 2016-09-30 DIAGNOSIS — T8133XD Disruption of traumatic injury wound repair, subsequent encounter: Secondary | ICD-10-CM | POA: Diagnosis not present

## 2016-09-30 DIAGNOSIS — T8130XD Disruption of wound, unspecified, subsequent encounter: Secondary | ICD-10-CM | POA: Diagnosis not present

## 2016-09-30 DIAGNOSIS — L97821 Non-pressure chronic ulcer of other part of left lower leg limited to breakdown of skin: Secondary | ICD-10-CM | POA: Diagnosis not present

## 2016-09-30 DIAGNOSIS — I83029 Varicose veins of left lower extremity with ulcer of unspecified site: Secondary | ICD-10-CM | POA: Diagnosis not present

## 2016-09-30 DIAGNOSIS — L97811 Non-pressure chronic ulcer of other part of right lower leg limited to breakdown of skin: Secondary | ICD-10-CM | POA: Diagnosis not present

## 2016-10-01 DIAGNOSIS — D509 Iron deficiency anemia, unspecified: Secondary | ICD-10-CM | POA: Diagnosis not present

## 2016-10-01 DIAGNOSIS — Z6825 Body mass index (BMI) 25.0-25.9, adult: Secondary | ICD-10-CM | POA: Diagnosis not present

## 2016-10-01 DIAGNOSIS — I10 Essential (primary) hypertension: Secondary | ICD-10-CM | POA: Diagnosis not present

## 2016-10-01 DIAGNOSIS — E782 Mixed hyperlipidemia: Secondary | ICD-10-CM | POA: Diagnosis not present

## 2016-10-01 DIAGNOSIS — E11649 Type 2 diabetes mellitus with hypoglycemia without coma: Secondary | ICD-10-CM | POA: Diagnosis not present

## 2016-10-09 ENCOUNTER — Ambulatory Visit (INDEPENDENT_AMBULATORY_CARE_PROVIDER_SITE_OTHER): Payer: Medicare Other | Admitting: Otolaryngology

## 2016-10-09 ENCOUNTER — Telehealth: Payer: Self-pay | Admitting: Cardiology

## 2016-10-09 ENCOUNTER — Encounter: Payer: Medicare Other | Admitting: *Deleted

## 2016-10-09 DIAGNOSIS — H903 Sensorineural hearing loss, bilateral: Secondary | ICD-10-CM | POA: Diagnosis not present

## 2016-10-09 NOTE — Telephone Encounter (Signed)
LMOVM reminding pt to send remote transmission.   

## 2016-10-10 ENCOUNTER — Encounter: Payer: Self-pay | Admitting: Cardiology

## 2016-10-14 DIAGNOSIS — I872 Venous insufficiency (chronic) (peripheral): Secondary | ICD-10-CM | POA: Diagnosis not present

## 2016-10-14 DIAGNOSIS — Z9181 History of falling: Secondary | ICD-10-CM | POA: Diagnosis not present

## 2016-10-14 DIAGNOSIS — L97211 Non-pressure chronic ulcer of right calf limited to breakdown of skin: Secondary | ICD-10-CM | POA: Diagnosis not present

## 2016-10-20 ENCOUNTER — Telehealth: Payer: Self-pay | Admitting: Cardiovascular Disease

## 2016-10-20 NOTE — Telephone Encounter (Signed)
Confirmed that we received remote transmission with patient.

## 2016-10-20 NOTE — Telephone Encounter (Signed)
New message     1. Has your device fired? no  2. Is you device beeping? no  3. Are you experiencing draining or swelling at device site? No  4. Are you calling to see if we received your device transmission? Roberto Scales said she did it yesterday  5. Have you passed out? no

## 2016-10-21 ENCOUNTER — Other Ambulatory Visit (HOSPITAL_COMMUNITY)
Admission: RE | Admit: 2016-10-21 | Discharge: 2016-10-21 | Disposition: A | Discharge status: Home / Self Care | Payer: Medicare Other | Source: Other Acute Inpatient Hospital | Attending: Urology | Admitting: Urology

## 2016-10-21 ENCOUNTER — Ambulatory Visit (INDEPENDENT_AMBULATORY_CARE_PROVIDER_SITE_OTHER): Payer: Medicare Other | Admitting: Urology

## 2016-10-21 DIAGNOSIS — R109 Unspecified abdominal pain: Secondary | ICD-10-CM | POA: Insufficient documentation

## 2016-10-21 DIAGNOSIS — C678 Malignant neoplasm of overlapping sites of bladder: Secondary | ICD-10-CM

## 2016-10-23 LAB — URINE CULTURE

## 2016-11-25 DEATH — deceased

## 2016-12-08 ENCOUNTER — Encounter: Payer: Medicare Other | Admitting: *Deleted

## 2016-12-08 ENCOUNTER — Telehealth: Payer: Self-pay | Admitting: Cardiology

## 2016-12-08 NOTE — Telephone Encounter (Signed)
LMOVM reminding pt to send remote transmission.   

## 2017-08-16 IMAGING — DX DG KNEE COMPLETE 4+V*R*
4 series · 4 of 4 positions shown · non-contrast
Comparison: None.

CLINICAL DATA: Right medial knee pain after fall 1 week ago.

EXAM:
RIGHT KNEE - COMPLETE 4+ VIEW

[knee ap (1 of 3)]
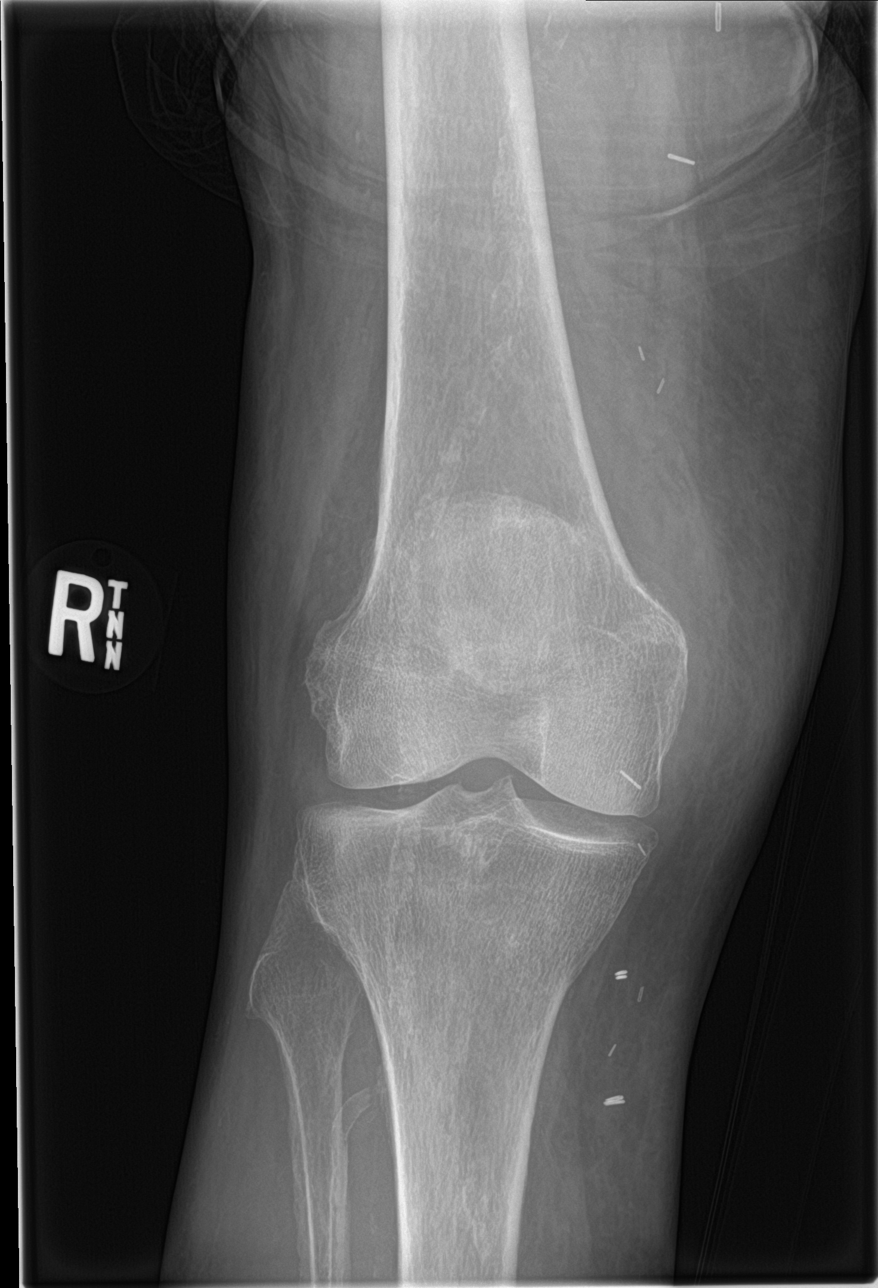

[knee lat]
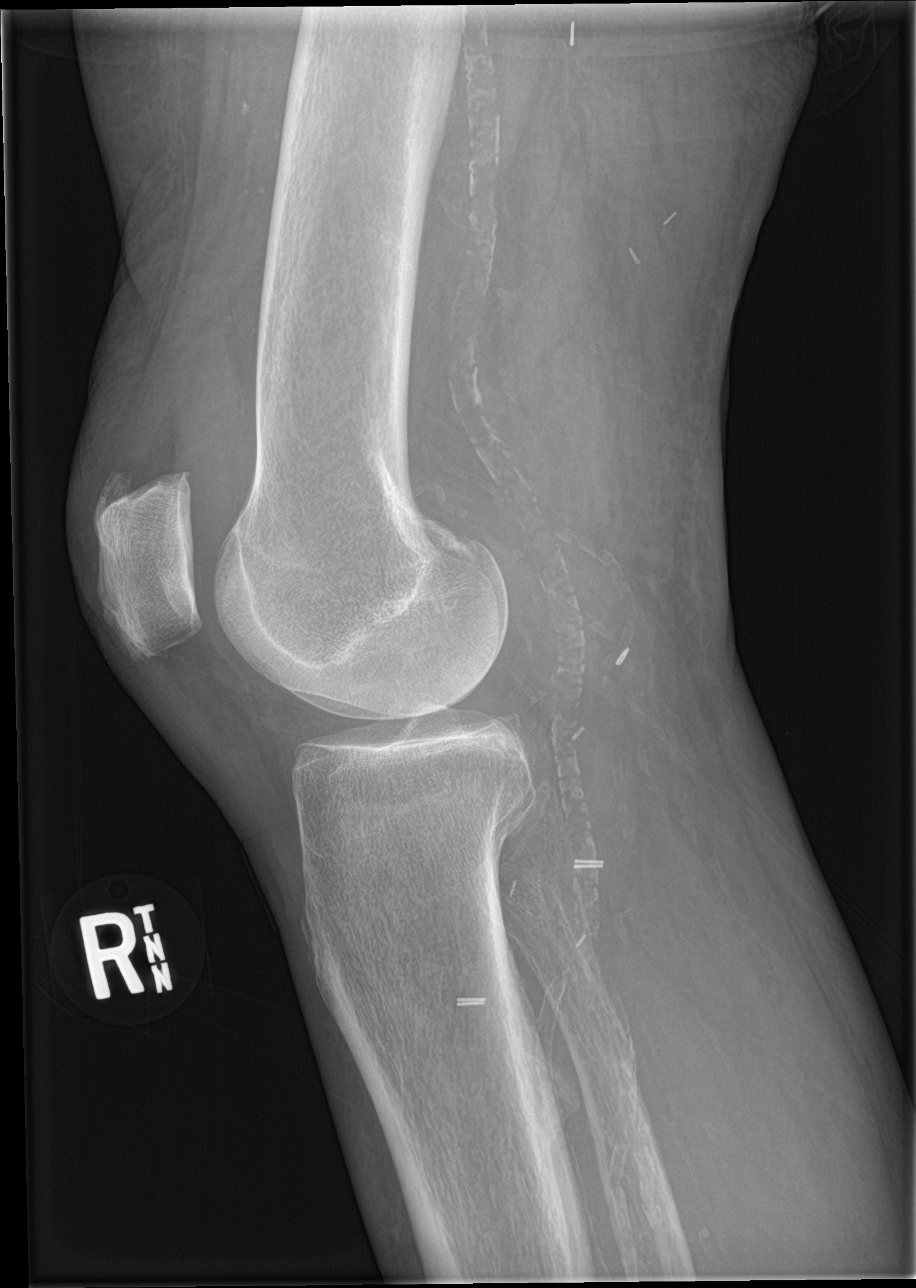

[knee ap (2 of 3)]
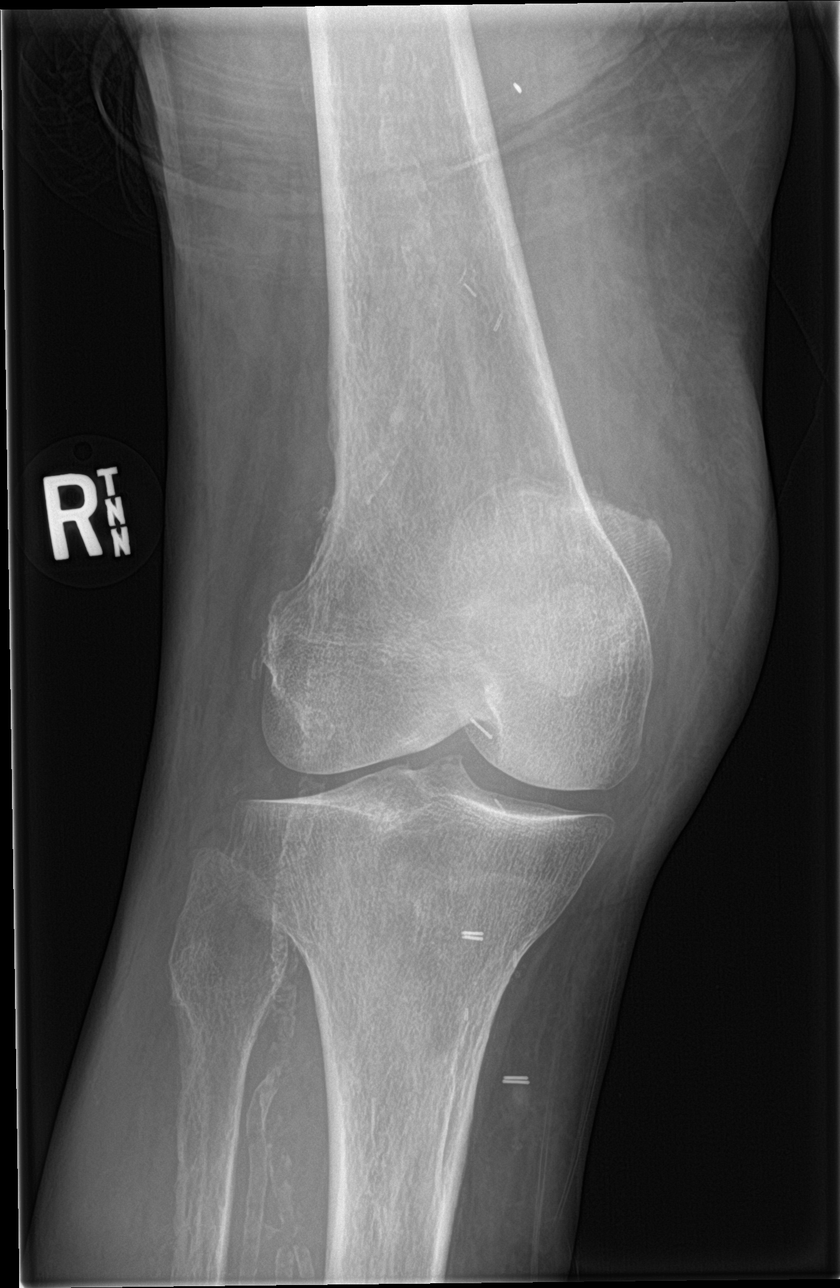

[knee ap (3 of 3)]
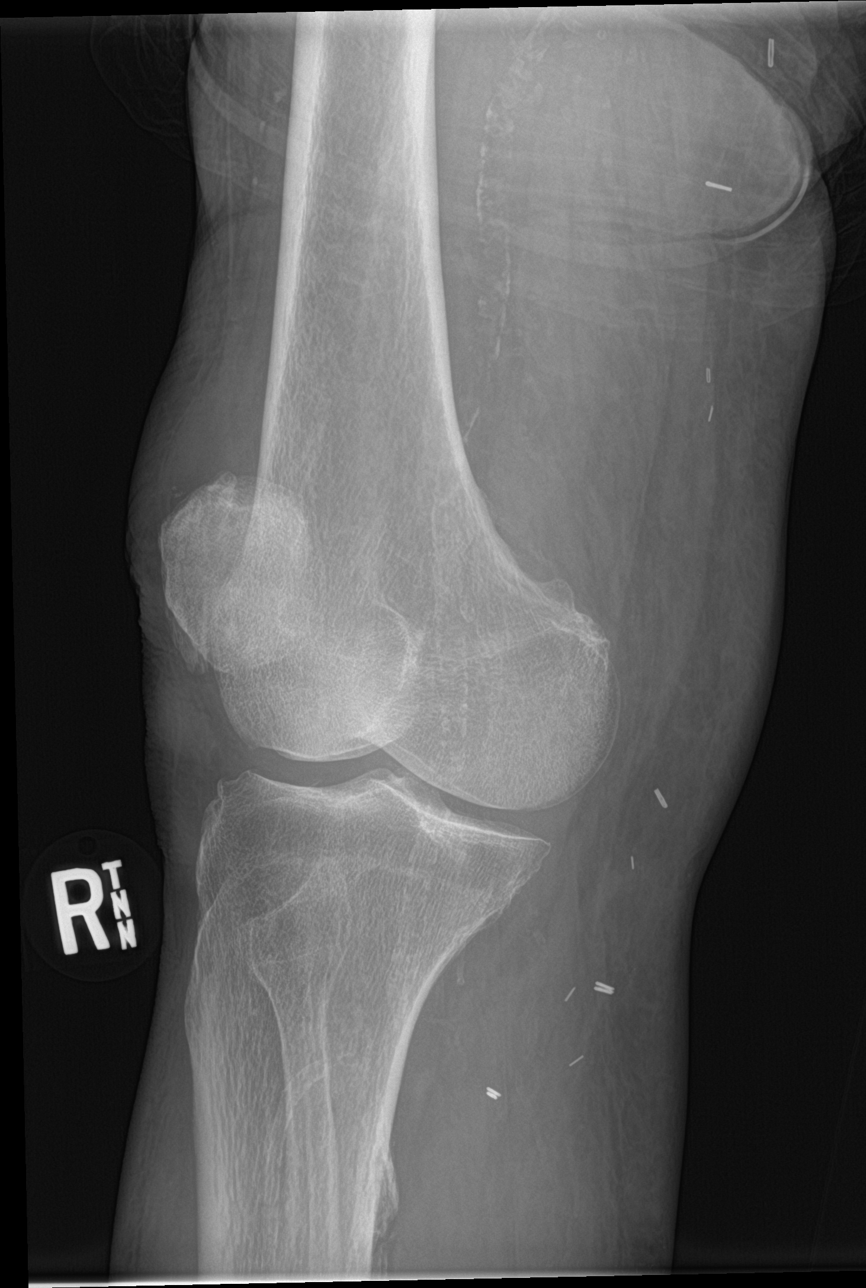

[4 of 4 positions shown; findings below may reference images not displayed]

FINDINGS: There is a moderate joint effusion. No acute bony abnormality.
Specifically, no fracture, subluxation, or dislocation. Soft tissues
are intact. Vascular calcifications noted.
IMPRESSION: No acute bony abnormality.  Moderate joint effusion.
